# Patient Record
Sex: Male | Born: 1965 | Race: White | Hispanic: No | Marital: Married | Smoking: Former smoker
Health system: Southern US, Community
[De-identification: ages and names within clinical notes are randomized; demographics above are authoritative.]

## PROBLEM LIST (undated history)

## (undated) DIAGNOSIS — Z9109 Other allergy status, other than to drugs and biological substances: Secondary | ICD-10-CM

## (undated) DIAGNOSIS — M1990 Unspecified osteoarthritis, unspecified site: Secondary | ICD-10-CM

## (undated) DIAGNOSIS — I10 Essential (primary) hypertension: Secondary | ICD-10-CM

## (undated) DIAGNOSIS — Z8619 Personal history of other infectious and parasitic diseases: Secondary | ICD-10-CM

## (undated) DIAGNOSIS — T7840XA Allergy, unspecified, initial encounter: Secondary | ICD-10-CM

## (undated) DIAGNOSIS — K219 Gastro-esophageal reflux disease without esophagitis: Secondary | ICD-10-CM

## (undated) HISTORY — DX: Essential (primary) hypertension: I10

## (undated) HISTORY — PX: VASECTOMY: SHX75

## (undated) HISTORY — DX: Gastro-esophageal reflux disease without esophagitis: K21.9

## (undated) HISTORY — DX: Unspecified osteoarthritis, unspecified site: M19.90

## (undated) HISTORY — DX: Other allergy status, other than to drugs and biological substances: Z91.09

## (undated) HISTORY — DX: Personal history of other infectious and parasitic diseases: Z86.19

## (undated) HISTORY — DX: Allergy, unspecified, initial encounter: T78.40XA

---

## 1997-09-15 DIAGNOSIS — Z8619 Personal history of other infectious and parasitic diseases: Secondary | ICD-10-CM

## 1997-09-15 HISTORY — DX: Personal history of other infectious and parasitic diseases: Z86.19

## 2016-10-18 ENCOUNTER — Encounter (HOSPITAL_BASED_OUTPATIENT_CLINIC_OR_DEPARTMENT_OTHER): Payer: Self-pay

## 2016-10-18 ENCOUNTER — Emergency Department (HOSPITAL_COMMUNITY): Payer: BLUE CROSS/BLUE SHIELD | Admitting: Certified Registered"

## 2016-10-18 ENCOUNTER — Encounter (HOSPITAL_COMMUNITY)
Admission: EM | Disposition: A | Discharge status: Home / Self Care | Payer: Self-pay | Source: Home / Self Care | Attending: Emergency Medicine

## 2016-10-18 ENCOUNTER — Emergency Department (HOSPITAL_BASED_OUTPATIENT_CLINIC_OR_DEPARTMENT_OTHER): Payer: BLUE CROSS/BLUE SHIELD

## 2016-10-18 ENCOUNTER — Ambulatory Visit (HOSPITAL_BASED_OUTPATIENT_CLINIC_OR_DEPARTMENT_OTHER)
Admission: EM | Admit: 2016-10-18 | Discharge: 2016-10-18 | Disposition: A | Discharge status: Home / Self Care | Payer: BLUE CROSS/BLUE SHIELD | Attending: Emergency Medicine | Admitting: Emergency Medicine

## 2016-10-18 DIAGNOSIS — S62521B Displaced fracture of distal phalanx of right thumb, initial encounter for open fracture: Secondary | ICD-10-CM | POA: Diagnosis present

## 2016-10-18 DIAGNOSIS — Z23 Encounter for immunization: Secondary | ICD-10-CM | POA: Diagnosis not present

## 2016-10-18 DIAGNOSIS — W230XXA Caught, crushed, jammed, or pinched between moving objects, initial encounter: Secondary | ICD-10-CM | POA: Insufficient documentation

## 2016-10-18 DIAGNOSIS — R52 Pain, unspecified: Secondary | ICD-10-CM

## 2016-10-18 DIAGNOSIS — S6701XA Crushing injury of right thumb, initial encounter: Secondary | ICD-10-CM | POA: Insufficient documentation

## 2016-10-18 HISTORY — PX: I & D EXTREMITY: SHX5045

## 2016-10-18 SURGERY — IRRIGATION AND DEBRIDEMENT EXTREMITY
Anesthesia: General | Site: Thumb | Laterality: Right

## 2016-10-18 MED ORDER — CEFAZOLIN SODIUM 1 G IJ SOLR
INTRAMUSCULAR | Status: AC
  Filled 2016-10-18: qty 20

## 2016-10-18 MED ORDER — HYDROMORPHONE HCL 1 MG/ML IJ SOLN: 0.2500 mg | INTRAMUSCULAR | Status: DC | PRN

## 2016-10-18 MED ORDER — BUPIVACAINE HCL (PF) 0.25 % IJ SOLN
INTRAMUSCULAR | Status: DC | PRN
  Administered 2016-10-18: 10 mL

## 2016-10-18 MED ORDER — MIDAZOLAM HCL 2 MG/2ML IJ SOLN
INTRAMUSCULAR | Status: AC
  Filled 2016-10-18: qty 2

## 2016-10-18 MED ORDER — MEPERIDINE HCL 25 MG/ML IJ SOLN: 6.2500 mg | INTRAMUSCULAR | Status: DC | PRN

## 2016-10-18 MED ORDER — BUPIVACAINE HCL (PF) 0.25 % IJ SOLN
INTRAMUSCULAR | Status: AC
  Filled 2016-10-18: qty 30

## 2016-10-18 MED ORDER — PROMETHAZINE HCL 25 MG/ML IJ SOLN
6.2500 mg | INTRAMUSCULAR | Status: DC | PRN
  Administered 2016-10-18: 6.25 mg via INTRAVENOUS

## 2016-10-18 MED ORDER — OXYCODONE-ACETAMINOPHEN 5-325 MG PO TABS: ORAL_TABLET | ORAL | 0 refills | Status: DC

## 2016-10-18 MED ORDER — MORPHINE SULFATE (PF) 4 MG/ML IV SOLN
INTRAVENOUS | Status: AC
  Administered 2016-10-18: 4 mg via INTRAVENOUS
  Filled 2016-10-18: qty 1

## 2016-10-18 MED ORDER — ONDANSETRON HCL 4 MG/2ML IJ SOLN
INTRAMUSCULAR | Status: DC | PRN
  Administered 2016-10-18: 4 mg via INTRAVENOUS

## 2016-10-18 MED ORDER — OXYCODONE HCL 5 MG PO TABS: 5.0000 mg | ORAL_TABLET | Freq: Once | ORAL | Status: DC | PRN

## 2016-10-18 MED ORDER — PROMETHAZINE HCL 25 MG/ML IJ SOLN
INTRAMUSCULAR | Status: AC
  Filled 2016-10-18: qty 1

## 2016-10-18 MED ORDER — SUCCINYLCHOLINE CHLORIDE 200 MG/10ML IV SOSY
PREFILLED_SYRINGE | INTRAVENOUS | Status: AC
  Filled 2016-10-18: qty 20

## 2016-10-18 MED ORDER — FENTANYL CITRATE (PF) 100 MCG/2ML IJ SOLN
INTRAMUSCULAR | Status: AC
  Filled 2016-10-18: qty 2

## 2016-10-18 MED ORDER — CEFAZOLIN SODIUM 1 G IJ SOLR
INTRAMUSCULAR | Status: DC | PRN
  Administered 2016-10-18: 2 g via INTRAMUSCULAR

## 2016-10-18 MED ORDER — LIDOCAINE 2% (20 MG/ML) 5 ML SYRINGE
INTRAMUSCULAR | Status: AC
  Filled 2016-10-18: qty 15

## 2016-10-18 MED ORDER — MORPHINE SULFATE (PF) 4 MG/ML IV SOLN
4.0000 mg | Freq: Once | INTRAVENOUS | Status: AC
  Administered 2016-10-18: 4 mg via INTRAVENOUS

## 2016-10-18 MED ORDER — FENTANYL CITRATE (PF) 100 MCG/2ML IJ SOLN
INTRAMUSCULAR | Status: DC | PRN
  Administered 2016-10-18 (×2): 100 ug via INTRAVENOUS

## 2016-10-18 MED ORDER — SUCCINYLCHOLINE CHLORIDE 20 MG/ML IJ SOLN
INTRAMUSCULAR | Status: DC | PRN
  Administered 2016-10-18: 100 mg via INTRAVENOUS

## 2016-10-18 MED ORDER — TETANUS-DIPHTH-ACELL PERTUSSIS 5-2.5-18.5 LF-MCG/0.5 IM SUSP
0.5000 mL | Freq: Once | INTRAMUSCULAR | Status: AC
  Administered 2016-10-18: 0.5 mL via INTRAMUSCULAR
  Filled 2016-10-18: qty 0.5

## 2016-10-18 MED ORDER — PROPOFOL 10 MG/ML IV BOLUS
INTRAVENOUS | Status: AC
  Filled 2016-10-18: qty 20

## 2016-10-18 MED ORDER — LIDOCAINE HCL (CARDIAC) 20 MG/ML IV SOLN
INTRAVENOUS | Status: DC | PRN
  Administered 2016-10-18: 5 mL via INTRATRACHEAL

## 2016-10-18 MED ORDER — CEFAZOLIN IN D5W 1 GM/50ML IV SOLN
1.0000 g | Freq: Once | INTRAVENOUS | Status: AC
  Administered 2016-10-18: 1 g via INTRAVENOUS
  Filled 2016-10-18: qty 50

## 2016-10-18 MED ORDER — SULFAMETHOXAZOLE-TRIMETHOPRIM 800-160 MG PO TABS: 1.0000 | ORAL_TABLET | Freq: Two times a day (BID) | ORAL | 0 refills | Status: DC

## 2016-10-18 MED ORDER — OXYCODONE HCL 5 MG/5ML PO SOLN: 5.0000 mg | Freq: Once | ORAL | Status: DC | PRN

## 2016-10-18 MED ORDER — MIDAZOLAM HCL 2 MG/2ML IJ SOLN
INTRAMUSCULAR | Status: DC | PRN
  Administered 2016-10-18: 2 mg via INTRAVENOUS

## 2016-10-18 MED ORDER — PROPOFOL 10 MG/ML IV BOLUS
INTRAVENOUS | Status: DC | PRN
  Administered 2016-10-18: 200 mg via INTRAVENOUS

## 2016-10-18 MED ORDER — MORPHINE SULFATE (PF) 4 MG/ML IV SOLN
4.0000 mg | Freq: Once | INTRAVENOUS | Status: AC
  Administered 2016-10-18: 4 mg via INTRAVENOUS
  Filled 2016-10-18: qty 1

## 2016-10-18 MED ORDER — KETOROLAC TROMETHAMINE 30 MG/ML IJ SOLN: 30.0000 mg | Freq: Once | INTRAMUSCULAR | Status: DC

## 2016-10-18 MED ORDER — ACETAMINOPHEN 500 MG PO TABS
1000.0000 mg | ORAL_TABLET | Freq: Once | ORAL | Status: AC
  Administered 2016-10-18: 1000 mg via ORAL
  Filled 2016-10-18: qty 2

## 2016-10-18 SURGICAL SUPPLY — 32 items
BANDAGE COBAN STERILE 2 (GAUZE/BANDAGES/DRESSINGS) ×2 IMPLANT
BNDG COHESIVE 1X5 TAN STRL LF (GAUZE/BANDAGES/DRESSINGS) IMPLANT
BNDG CONFORM 2 STRL LF (GAUZE/BANDAGES/DRESSINGS) IMPLANT
BNDG ESMARK 4X9 LF (GAUZE/BANDAGES/DRESSINGS) ×2 IMPLANT
BRUSH SCRUB EZ PLAIN DRY (MISCELLANEOUS) ×2 IMPLANT
CANISTER SUCTION 2500CC (MISCELLANEOUS) ×2 IMPLANT
CORDS BIPOLAR (ELECTRODE) ×2 IMPLANT
COVER SURGICAL LIGHT HANDLE (MISCELLANEOUS) ×2 IMPLANT
DECANTER SPIKE VIAL GLASS SM (MISCELLANEOUS) ×2 IMPLANT
GAUZE SPONGE 4X4 12PLY STRL (GAUZE/BANDAGES/DRESSINGS) ×2 IMPLANT
GAUZE XEROFORM 5X9 LF (GAUZE/BANDAGES/DRESSINGS) ×2 IMPLANT
GLOVE BIO SURGEON STRL SZ7.5 (GLOVE) ×2 IMPLANT
GLOVE BIOGEL PI IND STRL 8 (GLOVE) ×1 IMPLANT
GLOVE BIOGEL PI INDICATOR 8 (GLOVE) ×1
GOWN STRL REUS W/ TWL LRG LVL3 (GOWN DISPOSABLE) ×1 IMPLANT
GOWN STRL REUS W/TWL LRG LVL3 (GOWN DISPOSABLE) ×1
K-WIRE .035 (WIRE) ×2
KIT BASIN OR (CUSTOM PROCEDURE TRAY) ×2 IMPLANT
KIT ROOM TURNOVER OR (KITS) ×2 IMPLANT
KWIRE .035 (WIRE) ×1 IMPLANT
NEEDLE HYPO 25X1 1.5 SAFETY (NEEDLE) IMPLANT
NS IRRIG 1000ML POUR BTL (IV SOLUTION) ×2 IMPLANT
PACK ORTHO EXTREMITY (CUSTOM PROCEDURE TRAY) ×2 IMPLANT
PAD ARMBOARD 7.5X6 YLW CONV (MISCELLANEOUS) ×4 IMPLANT
SCRUB BETADINE 4OZ XXX (MISCELLANEOUS) ×2 IMPLANT
SOLUTION BETADINE 4OZ (MISCELLANEOUS) ×2 IMPLANT
SPLINT FINGER W/BULB (SOFTGOODS) ×2 IMPLANT
SUT ETHILON 4 0 PS 2 18 (SUTURE) ×2 IMPLANT
SYR CONTROL 10ML LL (SYRINGE) IMPLANT
TOWEL OR 17X24 6PK STRL BLUE (TOWEL DISPOSABLE) ×2 IMPLANT
TOWEL OR 17X26 10 PK STRL BLUE (TOWEL DISPOSABLE) ×2 IMPLANT
UNDERPAD 30X30 (UNDERPADS AND DIAPERS) ×2 IMPLANT

## 2016-10-18 NOTE — Transfer of Care (Signed)
Immediate Anesthesia Transfer of Care Note  Patient: [name]  Procedure(s) Performed: Procedure(s): IRRIGATION AND DEBRIDEMENT OPEN FRACTURE PINNING OF THUMB, REPAIR OF NAILBED AND KNUCKLE ABRASIONS (Right)  Patient Location: PACU  Anesthesia Type:General  Level of Consciousness: awake, alert  and oriented  Airway & Oxygen Therapy: Patient connected to face mask oxygen  Post-op Assessment: Report given to RN and Post -op Vital signs reviewed and stable  Post vital signs: Reviewed and stable  Last Vitals:  Vitals:   10/18/16 1845 10/18/16 2053  BP: 138/86 (!) (P) 144/95  Pulse: 65   Resp:  (P) 18  Temp:  (P) 36.3 C    Last Pain:  Vitals:   10/18/16 1856  TempSrc:   PainSc: 8          Complications: No apparent anesthesia complications

## 2016-10-18 NOTE — Anesthesia Procedure Notes (Signed)
Procedure Name: Intubation Date/Time: 10/18/2016 7:53 PM Performed by: Valetta Fuller Pre-anesthesia Checklist: Patient identified, Emergency Drugs available, Suction available and Patient being monitored Patient Re-evaluated:Patient Re-evaluated prior to inductionOxygen Delivery Method: Circle system utilized Preoxygenation: Pre-oxygenation with 100% oxygen Intubation Type: IV induction Ventilation: Mask ventilation without difficulty Laryngoscope Size: Miller and 2 Tube type: Oral Tube size: 7.5 mm Number of attempts: 1 Airway Equipment and Method: Stylet Placement Confirmation: ETT inserted through vocal cords under direct vision,  positive ETCO2 and breath sounds checked- equal and bilateral Secured at: 24 cm Tube secured with: Tape Dental Injury: Teeth and Oropharynx as per pre-operative assessment

## 2016-10-18 NOTE — Discharge Instructions (Addendum)

## 2016-10-18 NOTE — Brief Op Note (Signed)
10/18/2016  8:47 PM  PATIENT:  [name]  51 y.o. male  PRE-OPERATIVE DIAGNOSIS:  open thumb fx  POST-OPERATIVE DIAGNOSIS:  open thumb fx, knuckles laceration  PROCEDURE:  Procedure(s): IRRIGATION AND DEBRIDEMENT OPEN FRACTURE PINNING OF THUMB, REPAIR OF NAILBED AND KNUCKLE ABRASIONS (Right)  SURGEON:  Surgeon(s) and Role:    * Leanora Cover, MD - Primary  PHYSICIAN ASSISTANT:   ASSISTANTS: none   ANESTHESIA:   general  EBL:  Total I/O In: -  Out: 10 [Blood:10]  BLOOD ADMINISTERED:none  DRAINS: none   LOCAL MEDICATIONS USED:  MARCAINE     SPECIMEN:  No Specimen  DISPOSITION OF SPECIMEN:  N/A  COUNTS:  YES  TOURNIQUET:   Total Tourniquet Time Documented: area (laterality) - 34 minutes Total: area (laterality) - 34 minutes   DICTATION: .Other Dictation: Dictation Number [id number]  PLAN OF CARE: Discharge to home after PACU  PATIENT DISPOSITION:  PACU - hemodynamically stable.

## 2016-10-18 NOTE — ED Triage Notes (Signed)
Pt reports washing machine dropping on right hand. Laceration to right thumb. Bleeding noted controlled. Dressing applied.

## 2016-10-18 NOTE — Op Note (Signed)
[id number] 

## 2016-10-18 NOTE — ED Notes (Signed)
Dr Fredna Dow in room. Pt's mother Trinidad Curet signed consent with physician due to pt's right hand injury and this RN signed as witness.

## 2016-10-18 NOTE — H&P (Signed)
  [name] is an 51 y.o. male.   Chief Complaint: right thumb crush HPI: 51 yo rhd male states he smashed right thumb under washing machine he was trying to move with a dolly earlier today.  Seen at [facility] where XR revealed comminuted distal phalanx fracture.  Sent to [facility] for further care.  He reports throbbing pain of 8/10 severity.  Alleviated with immobilization and elevation.  Aggravated with palpation.  Case discussed with Veryl Speak, MD and his note from 10/18/2016 reviewed. Xrays viewed and interpreted by me: ap, lateral, oblique views of thumb show comminuted distal phalanx fracture of right thumb. Labs reviewed: none  Allergies: No Known Allergies  History reviewed. No pertinent past medical history.  History reviewed. No pertinent surgical history.  Family History: History reviewed. No pertinent family history.  Social History:   reports that he has never smoked. He has never used smokeless tobacco. He reports that he drinks alcohol. His drug history is not on file.  Medications:  (Not in a hospital admission)  No results found for this or any previous visit (from the past 48 hour(s)).  Dg Hand Complete Right  Result Date: 10/18/2016 CLINICAL DATA:  51 year old male status post crush injury to hand while moving all washing machine. Initial encounter. EXAM: RIGHT HAND - COMPLETE 3+ VIEW COMPARISON:  None. FINDINGS: Clothing material about the right wrist and forearm mildly degrades bone detail there. The distal radius and ulna appear intact. Carpal bone alignment and joint spaces within normal limits. Metacarpals intact ; possible healed fracture of the fifth metacarpal. The second through fifth phalanges are intact. There is a mildly comminuted but nondisplaced fracture through the distal condyles of the thumb proximal phalanx (image 3). There is moderate to severe comminution of the right thumb distal phalanx which does appear to extend into the IP joint. The tuft fragments  are most displaced. IMPRESSION: 1. Comminution of the right thumb distal phalanx with probable intra-articular involvement. The tuft fragments are most displaced. 2. More subtle mildly comminuted fracture of the distal aspect of the thumb proximal phalanx. Electronically Signed   By: Genevie Ann M.D.   On: 10/18/2016 12:22     A comprehensive review of systems was negative. Review of Systems: No fevers, chills, night sweats, chest pain, shortness of breath, nausea, vomiting, diarrhea, constipation, easy bleeding or bruising, headaches, dizziness, vision changes, fainting.   Blood pressure 168/100, pulse 102, temperature 98 F (36.7 C), temperature source Oral, resp. rate 18, height 6\' 2"  (1.88 m), weight 136.1 kg (300 lb), SpO2 100 %.  General appearance: alert, cooperative and appears stated age Head: Normocephalic, without obvious abnormality, atraumatic Neck: supple, symmetrical, trachea midline Resp: clear to auscultation bilaterally Cardio: regular rate and rhythm GI: non-tender Extremities: Intact sensation and capillary refill all digits.  +epl/fpl/io.  Intact capillary refill in thumb, though slower on ulnar side.  Wound through nail. Able to flex and extend IP joint. Pulses: 2+ and symmetric Skin: Skin color, texture, turgor normal. No rashes or lesions Neurologic: Grossly normal Incision/Wound: as above  Assessment/Plan Right thumb crush injury with distal phalanx fracture and nail injury.  Recommend exploration in OR with possible pin fixation and repair as necessary.  Risks, benefits, and alternatives of surgery were discussed and the patient agrees with the plan of care.   Rosia Syme R 10/18/2016, 6:53 PM

## 2016-10-18 NOTE — ED Notes (Signed)
Patient transported to X-ray 

## 2016-10-18 NOTE — Anesthesia Preprocedure Evaluation (Signed)
Anesthesia Evaluation  Patient identified by MRN, date of birth, ID band Patient awake    Reviewed: Allergy & Precautions, NPO status , Patient's Chart, lab work & pertinent test results  Airway Mallampati: II  TM Distance: >3 FB Neck ROM: Full    Dental no notable dental hx.    Pulmonary neg pulmonary ROS,    Pulmonary exam normal breath sounds clear to auscultation       Cardiovascular negative cardio ROS Normal cardiovascular exam Rhythm:Regular Rate:Normal     Neuro/Psych negative neurological ROS  negative psych ROS   GI/Hepatic negative GI ROS, Neg liver ROS,   Endo/Other  negative endocrine ROS  Renal/GU negative Renal ROS  negative genitourinary   Musculoskeletal negative musculoskeletal ROS (+)   Abdominal   Peds  Hematology negative hematology ROS (+)   Anesthesia Other Findings   Reproductive/Obstetrics                             Anesthesia Physical Anesthesia Plan  ASA: II  Anesthesia Plan: General   Post-op Pain Management:    Induction: Intravenous  Airway Management Planned: Oral ETT  Additional Equipment:   Intra-op Plan:   Post-operative Plan: Extubation in OR  Informed Consent: I have reviewed the patients History and Physical, chart, labs and discussed the procedure including the risks, benefits and alternatives for the proposed anesthesia with the patient or authorized representative who has indicated his/her understanding and acceptance.   Dental advisory given  Plan Discussed with: CRNA  Anesthesia Plan Comments:         Anesthesia Quick Evaluation

## 2016-10-18 NOTE — ED Notes (Signed)
Per Dr. Stark Jock, pt can transfer with IV in place. IV wrapped with gauze and secured.

## 2016-10-18 NOTE — ED Provider Notes (Signed)
[facility] Provider Note   CSN: [id number] Arrival date & time: 10/18/16  1154     History   Chief Complaint Chief Complaint  Patient presents with  . Extremity Laceration    HPI [name] is a 51 y.o. male.  Patient is a 51 year old male with no significant past medical history. He presents for evaluation of a right arm injury. He reports he was using a dolly to move a washing machine when his thumb became pinched between the machine and the dolly. This avulsed his nail and cause a laceration to his thumb.   The history is provided by the patient.  Hand Injury   The incident occurred less than 1 hour ago. The incident occurred at home. Injury mechanism: As above. Pain location: Right thumb. The quality of the pain is described as throbbing. The pain is moderate. The pain has been constant since the incident. The symptoms are aggravated by movement and palpation.    History reviewed. No pertinent past medical history.  There are no active problems to display for this patient.   History reviewed. No pertinent surgical history.     Home Medications    Prior to Admission medications   Not on File    Family History No family history on file.  Social History Social History  Substance Use Topics  . Smoking status: Never Smoker  . Smokeless tobacco: Never Used  . Alcohol use Yes     Allergies   Patient has no known allergies.   Review of Systems Review of Systems  All other systems reviewed and are negative.    Physical Exam Updated Vital Signs BP (!) 172/114   Pulse 98   Temp 98 F (36.7 C) (Oral)   Resp 20   Ht 6\' 2"  (1.88 m)   Wt 300 lb (136.1 kg)   SpO2 99%   BMI 38.52 kg/m   Physical Exam  Constitutional: He is oriented to person, place, and time. He appears well-developed and well-nourished. No distress.  HENT:  Head: Normocephalic and atraumatic.  Neck: Normal range of motion. Neck supple.  Pulmonary/Chest: Effort  normal.  Musculoskeletal: Normal range of motion.  The right thumb has deformity at the IP joint. There is an avulsion of the thumbnail from the nailbed and a laceration that extends through the nailbed.  Neurological: He is alert and oriented to person, place, and time.  Skin: Skin is warm and dry. He is not diaphoretic.  Nursing note and vitals reviewed.        ED Treatments / Results  Labs (all labs ordered are listed, but only abnormal results are displayed) Labs Reviewed - No data to display  EKG  EKG Interpretation None       Radiology Dg Hand Complete Right  Result Date: 10/18/2016 CLINICAL DATA:  51 year old male status post crush injury to hand while moving all washing machine. Initial encounter. EXAM: RIGHT HAND - COMPLETE 3+ VIEW COMPARISON:  None. FINDINGS: Clothing material about the right wrist and forearm mildly degrades bone detail there. The distal radius and ulna appear intact. Carpal bone alignment and joint spaces within normal limits. Metacarpals intact ; possible healed fracture of the fifth metacarpal. The second through fifth phalanges are intact. There is a mildly comminuted but nondisplaced fracture through the distal condyles of the thumb proximal phalanx (image 3). There is moderate to severe comminution of the right thumb distal phalanx which does appear to extend into the IP joint. The tuft  fragments are most displaced. IMPRESSION: 1. Comminution of the right thumb distal phalanx with probable intra-articular involvement. The tuft fragments are most displaced. 2. More subtle mildly comminuted fracture of the distal aspect of the thumb proximal phalanx. Electronically Signed   By: Genevie Ann M.D.   On: 10/18/2016 12:22    Procedures Procedures (including critical care time)  Medications Ordered in ED Medications - No data to display   Initial Impression / Assessment and Plan / ED Course  I have reviewed the triage vital signs and the nursing  notes.  Pertinent labs & imaging results that were available during my care of the patient were reviewed by me and considered in my medical decision making (see chart for details).  Patient has an open fracture of his right thumb with an avulsion of the thumbnail. I've discussed this with Dr. Fredna Dow who recommends transfer to [facility] where this can be repaired. He has been given and tetanus has been updated.  Final Clinical Impressions(s) / ED Diagnoses   Final diagnoses:  Pain    New Prescriptions New Prescriptions   No medications on file     Veryl Speak, MD 10/18/16 1323

## 2016-10-18 NOTE — ED Notes (Signed)
Taken to short stay.

## 2016-10-18 NOTE — ED Notes (Signed)
ED Provider at bedside. 

## 2016-10-19 NOTE — Op Note (Addendum)
[name], [name]               ACCOUNT NO.:  [id number]  MEDICAL RECORD NO.:  [id number]  LOCATION:  [facility]03                          FACILITY:  [facility]  PHYSICIAN:  Leanora Cover, MD        DATE OF BIRTH:  [birth year]  DATE OF PROCEDURE:  10/18/2016 DATE OF DISCHARGE:                              OPERATIVE REPORT   PREOPERATIVE DIAGNOSIS:  Right thumb crush injury with open comminuted distal phalanx fracture.  POSTOPERATIVE DIAGNOSIS:  Right thumb open comminuted distal phalanx fracture with skin and nail bed lacerations.  PROCEDURE:   1. Right thumb irrigation and debridement of open distal phalanx fracture with removal of bone fragment 2. Right thumb open reduction and pin fixation of comminuted distal phalanx fracture 3. Right thumb repair of skin and nail bed lacerations. 4. Repair intermediate wound dorsum of hand, 1.5-2.0 cm.  ASSISTANT:  None.  ANESTHESIA:  General.  IV FLUIDS:  Per anesthesia flow sheet.  ESTIMATED BLOOD LOSS:  Minimal.  COMPLICATIONS:  None.  SPECIMENS:  None.  TOURNIQUET TIME:  34 minutes.  DISPOSITION:  Stable to PACU.  INDICATIONS:  [name] is a 51 year old male who smashed his right thumb while moving a washing machine earlier today.  He was seen at [facility] where radiographs were taken revealing comminuted fracture of the distal phalanx.  He was transferred to [facility] for further care.  I recommended operative irrigation, debridement, potential pin fixation of the fracture and repair of skin and nail bed lacerations in the operating room.  Risks, benefits, alternatives of the surgery were discussed including the risks of blood loss, infection, damage to nerves, vessels, tendons, ligaments, bone, failure of surgery, need for additional surgery; complications with wound healing; continued pain; nonunion; malunion; stiffness; and nail deformity.  He voiced understanding of these risks, elected to proceed.  OPERATIVE COURSE:   After being identified preoperatively by myself, the patient agreed upon procedure and site of procedure.  Surgical site was marked.  The risks, benefits, and alternatives of surgery were reviewed, he wished to proceed.  Surgical consent had been signed.  His Ancef was redosed after having been given initially in the emergency department. He was transferred to the operating room and placed on the operating room table in supine position with the right upper extremity on arm board.  General anesthesia was induced by anesthesiologist.  Right upper extremity was prepped and draped in normal sterile orthopedic fashion. Surgical pause was performed between surgeons, anesthesia, and operating staff.  All were in agreement as to the patient, procedure, and site of procedure.  A digital block was performed with 10 mL of 0.25% plain Marcaine to aid in postoperative analgesia.  Tourniquet at the proximal aspect of the extremity was inflated to 250 mmHg after exsanguination of the limb with an Esmarch bandage.  The thumb wound was explored.  There was avulsion of the nail from the nail fold.  The nail was removed with a Soil scientist.  There was a jagged laceration through the nail bed in its distal half.  There was laceration at the ulnar side of the thumb skin.  The neurovascular structures were more volar than the injury.  There was comminution to the distal phalanx.  Contaminated hematoma was removed.  I did not see any gross contamination.  The knife was used for debridement of the skin and subcutaneous tissues.  A small fragment of devitalized bone was removed.  The wound and fracture were then copiously irrigated with sterile saline by bulb syringe.  The C-arm was used in AP and lateral projections.  Reduction of the distal phalanx fracture was performed, and a 0.035-inch K-wire was advanced from the tip of the thumb across the fracture site, across the DIP joint to stabilize the distal phalanx  fracture.  The skin wound was repaired with 5-0 Monocryl suture in an interrupted fashion.  The nail bed laceration was repaired with 6-0 chromic suture in interrupted fashion.  There was significant stellate laceration to the central portion of the nail bed.  Reapproximation of soft tissues was achieved, however.  There was an additional wound on the dorsum of the hand over the long finger metacarpal.  This was explored.  The extensor tendon was visualized and was intact.  There was some small amount of damage to the ulnar side of the tendon, but not enough requiring repair.  This wound was also copiously irrigated with sterile saline, closed with a 5-0 Monocryl suture in a horizontal mattress fashion.  The wound was approximately 1 1/2 to 2 cm in total length.  It was stellate in nature. The wounds were all dressed with sterile Xeroform, and a piece of Xeroform was placed in the nail fold.  They were then dressed with sterile 4x4s and wrapped with a Coban dressing lightly.  Alumafoam splint was placed on the thumb and wrapped lightly with a Coban dressing.  The tourniquet was deflated at 34 minutes.  All fingertips were pink with brisk capillary refill after deflation of tourniquet. Operative drapes were broken down.  The patient was awakened from anesthesia safely.  He was transferred back to stretcher and taken to PACU in stable condition.  I will see him back in the office in 1 week for postoperative followup.  I will give him Percocet 5/325 one to two p.o. q.6 hours p.r.n. pain dispensed #30 and Bactrim DS 1 p.o. b.i.d. x7 days.     Leanora Cover, MD     KK/MEDQ  D:  10/18/2016  T:  10/19/2016  Job:  [id number]  Addendum (10/31/16): Clarify procedures in summary and body of note.

## 2016-10-19 NOTE — Anesthesia Postprocedure Evaluation (Signed)
Anesthesia Post Note  Patient: [name]  Procedure(s) Performed: Procedure(s) (LRB): IRRIGATION AND DEBRIDEMENT OPEN FRACTURE PINNING OF THUMB, REPAIR OF NAILBED AND KNUCKLE ABRASIONS (Right)  Patient location during evaluation: PACU Anesthesia Type: General Level of consciousness: sedated and patient cooperative Pain management: pain level controlled Vital Signs Assessment: post-procedure vital signs reviewed and stable Respiratory status: spontaneous breathing Cardiovascular status: stable Anesthetic complications: no       Last Vitals:  Vitals:   10/18/16 2200 10/18/16 2215  BP: 127/86 133/86  Pulse:    Resp:    Temp:      Last Pain:  Vitals:   10/18/16 2215  TempSrc:   PainSc: 0-No pain                 Nolon Nations

## 2016-10-20 ENCOUNTER — Encounter (HOSPITAL_COMMUNITY): Payer: Self-pay | Admitting: Orthopedic Surgery

## 2017-03-11 ENCOUNTER — Ambulatory Visit (INDEPENDENT_AMBULATORY_CARE_PROVIDER_SITE_OTHER): Payer: BLUE CROSS/BLUE SHIELD | Admitting: Physician Assistant

## 2017-03-11 ENCOUNTER — Encounter: Payer: Self-pay | Admitting: Physician Assistant

## 2017-03-11 VITALS — BP 120/86 | HR 71 | Temp 98.3°F | Resp 16 | Ht 72.75 in | Wt 301.0 lb

## 2017-03-11 DIAGNOSIS — C44622 Squamous cell carcinoma of skin of right upper limb, including shoulder: Secondary | ICD-10-CM | POA: Insufficient documentation

## 2017-03-11 DIAGNOSIS — R5382 Chronic fatigue, unspecified: Secondary | ICD-10-CM

## 2017-03-11 DIAGNOSIS — Z8619 Personal history of other infectious and parasitic diseases: Secondary | ICD-10-CM | POA: Diagnosis not present

## 2017-03-11 DIAGNOSIS — Z0000 Encounter for general adult medical examination without abnormal findings: Secondary | ICD-10-CM | POA: Insufficient documentation

## 2017-03-11 LAB — COMPREHENSIVE METABOLIC PANEL
ALK PHOS: 59 U/L (ref 39–117)
ALT: 178 U/L — AB (ref 0–53)
AST: 130 U/L — ABNORMAL HIGH (ref 0–37)
Albumin: 4.6 g/dL (ref 3.5–5.2)
BILIRUBIN TOTAL: 0.6 mg/dL (ref 0.2–1.2)
BUN: 13 mg/dL (ref 6–23)
CALCIUM: 10 mg/dL (ref 8.4–10.5)
CO2: 29 mEq/L (ref 19–32)
Chloride: 101 mEq/L (ref 96–112)
Creatinine, Ser: 0.96 mg/dL (ref 0.40–1.50)
GFR: 88.4 mL/min (ref >60.00)
GLUCOSE: 109 mg/dL — AB (ref 70–99)
POTASSIUM: 4.8 meq/L (ref 3.5–5.1)
Sodium: 136 mEq/L (ref 135–145)
TOTAL PROTEIN: 7.5 g/dL (ref 6.0–8.3)

## 2017-03-11 LAB — CBC WITH DIFFERENTIAL/PLATELET
Basophils Absolute: 0 10*3/uL (ref 0.0–0.1)
Basophils Relative: 0.7 % (ref 0.0–3.0)
EOS PCT: 1.3 % (ref 0.0–5.0)
Eosinophils Absolute: 0.1 10*3/uL (ref 0.0–0.7)
HEMATOCRIT: 48.6 % (ref 39.0–52.0)
Hemoglobin: 17.1 g/dL — ABNORMAL HIGH (ref 13.0–17.0)
LYMPHS ABS: 1.1 10*3/uL (ref 0.7–4.0)
LYMPHS PCT: 18.3 % (ref 12.0–46.0)
MCHC: 35.1 g/dL (ref 30.0–36.0)
MCV: 92.7 fl (ref 78.0–100.0)
MONOS PCT: 9.4 % (ref 3.0–12.0)
Monocytes Absolute: 0.6 10*3/uL (ref 0.1–1.0)
NEUTROS ABS: 4.3 10*3/uL (ref 1.4–7.7)
NEUTROS PCT: 70.3 % (ref 43.0–77.0)
Platelets: 161 10*3/uL (ref 150.0–400.0)
RBC: 5.25 Mil/uL (ref 4.22–5.81)
RDW: 13.2 % (ref 11.5–15.5)
WBC: 6.1 10*3/uL (ref 4.0–10.5)

## 2017-03-11 LAB — LIPID PANEL
CHOLESTEROL: 177 mg/dL (ref 0–200)
HDL: 40.5 mg/dL (ref >39.00)
LDL Cholesterol: 114 mg/dL — ABNORMAL HIGH (ref 0–99)
NONHDL: 136.97
TRIGLYCERIDES: 115 mg/dL (ref 0.0–149.0)
Total CHOL/HDL Ratio: 4
VLDL: 23 mg/dL (ref 0.0–40.0)

## 2017-03-11 LAB — URINALYSIS, ROUTINE W REFLEX MICROSCOPIC
BILIRUBIN URINE: NEGATIVE
HGB URINE DIPSTICK: NEGATIVE
KETONES UR: NEGATIVE
LEUKOCYTES UA: NEGATIVE
NITRITE: NEGATIVE
PH: 6 (ref 5.0–8.0)
RBC / HPF: NONE SEEN (ref 0–?)
Specific Gravity, Urine: 1.02 (ref 1.000–1.030)
Total Protein, Urine: NEGATIVE
UROBILINOGEN UA: 0.2 (ref 0.0–1.0)
Urine Glucose: NEGATIVE
WBC UA: NONE SEEN (ref 0–?)

## 2017-03-11 LAB — TSH: TSH: 1.72 u[IU]/mL (ref 0.35–4.50)

## 2017-03-11 LAB — HEMOGLOBIN A1C: Hgb A1c MFr Bld: 5.3 % (ref 4.6–6.5)

## 2017-03-11 LAB — VITAMIN B12: VITAMIN B 12: 330 pg/mL (ref 211–911)

## 2017-03-11 NOTE — Progress Notes (Signed)
Pre visit review using our clinic review tool, if applicable. No additional management support is needed unless otherwise documented below in the visit note. 

## 2017-03-11 NOTE — Assessment & Plan Note (Signed)
Referral to Dermatology placed for management.

## 2017-03-11 NOTE — Progress Notes (Signed)
Patient presents to clinic today to establish care. Patient notes he has not seen a PCP in 3 years or so.   Patient is not currently doing any form of exercise. 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 Has recently started limiting carbohydrate intake.   Acute Concerns: Patient endorses gradual increasing fatigue over the past couple of years associated with weight gain. Notes times where he feels cold-natured. Notes dryness of skin. Denies urinary urgency, frequency, polyuria, polydipsia or polyphagia. Notes gradual decrease in visual acuity. Has noted several pound weight gain in the past year. Patent denies depressed mood or anhedonia. Denies anxiety.   Chronic Issues: History of Hepatitis C -- Diagnosed in 1999. Endorses treatment with Ribovarin and interferon in 2000-2001. Noted to be cured in 2001. Patient requesting Hep C RNA test to make sure that he is cured.    Health Maintenance: Immunizations -- Tetanus up-to-date.  HIV -- Denies concerns. Declines check today.   Past Medical History:  Diagnosis Date  . Arthritis    Lumbar Spine. Diagnosed around 15 years ago per patient.   . Environmental allergies   . History of chickenpox   . History of hepatitis C 1999   Treatment in 2000-2001. Ribovarin and Interferon injections. Late 2001 - Test of Cure    Past Surgical History:  Procedure Laterality Date  . I&D EXTREMITY Right 10/18/2016   Procedure: IRRIGATION AND DEBRIDEMENT OPEN FRACTURE PINNING OF THUMB, REPAIR OF NAILBED AND KNUCKLE ABRASIONS;  Surgeon: Leanora Cover, MD;  Location: [facility];  Service: Orthopedics;  Laterality: Right;  Marland Kitchen VASECTOMY      No current outpatient prescriptions on file prior to visit.   No current facility-administered medications on file prior to visit.     Allergies  Allergen Reactions  . Pollen Extract Shortness Of Breath and Swelling    Grass     Family History  Problem Relation Age of Onset  . Cancer Mother    Breast  . Healthy Father   . Healthy Sister   . Hypertension Maternal Uncle   . Dementia Maternal Uncle   . Alzheimer's disease Maternal Grandmother   . Hypertension Maternal Grandfather   . Alzheimer's disease Maternal Grandfather   . Heart attack Maternal Grandfather     Social History   Social History  . Marital status: Unknown    Spouse name: N/A  . Number of children: N/A  . Years of education: N/A   Occupational History  . Not on file.   Social History Main Topics  . Smoking status: Former Smoker    Years: 20.00    Types: Cigars    Quit date: 09/15/2012  . Smokeless tobacco: Never Used  . Alcohol use Yes     Comment: 10-12 Beers per week  . Drug use: No  . Sexual activity: Yes    Birth control/ protection: Surgical   Other Topics Concern  . Not on file   Social History Narrative  . No narrative on file   Review of Systems  Constitutional: Negative for fever and weight loss.  HENT: Negative for ear discharge, ear pain, hearing loss and tinnitus.   Eyes: Negative for blurred vision, double vision, photophobia and pain.  Respiratory: Negative for cough and shortness of breath.   Cardiovascular: Negative for chest pain and palpitations.  Gastrointestinal: Negative for abdominal pain, blood in stool, constipation, diarrhea, heartburn, melena, nausea and vomiting.  Genitourinary: Negative for dysuria, flank pain, frequency, hematuria and urgency.  Musculoskeletal:  Negative for falls.  Neurological: Negative for dizziness, loss of consciousness and headaches.  Endo/Heme/Allergies: Negative for environmental allergies.  Psychiatric/Behavioral: Negative for depression, hallucinations, substance abuse and suicidal ideas. The patient is not nervous/anxious and does not have insomnia.    BP 120/86   Pulse 71   Temp 98.3 F (36.8 C) (Oral)   Resp 16   Ht 6' 0.75" (1.848 m)   Wt (!) 301 lb (136.5 kg)   SpO2 98%   BMI 39.99 kg/m   Physical Exam  Constitutional:  He is oriented to person, place, and time and well-developed, well-nourished, and in no distress.  HENT:  Head: Normocephalic and atraumatic.  Right Ear: External ear normal.  Left Ear: External ear normal.  Nose: Nose normal.  Mouth/Throat: Oropharynx is clear and moist. No oropharyngeal exudate.  Eyes: Conjunctivae and EOM are normal. Pupils are equal, round, and reactive to light.  Neck: Neck supple. No thyromegaly present.  Cardiovascular: Normal rate, regular rhythm, normal heart sounds and intact distal pulses.   Pulmonary/Chest: Effort normal and breath sounds normal. No respiratory distress. He has no wheezes. He has no rales. He exhibits no tenderness.  Abdominal: Soft. Bowel sounds are normal. He exhibits no distension and no mass. There is no tenderness. There is no rebound and no guarding.  Genitourinary: Testes/scrotum normal.  Lymphadenopathy:    He has no cervical adenopathy.  Neurological: He is alert and oriented to person, place, and time.  Skin: Skin is warm and dry. No rash noted.     Psychiatric: Affect normal.  Vitals reviewed.  Assessment/Plan: Visit for preventive health examination Depression screen negative. Health Maintenance reviewed -- Tetanus up-to-date. Declines HIV screen. Preventive schedule discussed and handout given in AVS. Will obtain fasting labs today.   SCC (squamous cell carcinoma), arm, right Referral to Dermatology placed for management.   History of hepatitis C RNA level today.  Chronic fatigue Unclear. No clinical signs/symptoms of depression or anxiety. Concern for diabetes or hypothyroidism giving body habitus and symptoms. Will check Vitamin D and B12 levels. If all unremarkable, will consider testosterone level. Diet and exercise reviewed with patient.     Leeanne Rio, PA-C

## 2017-03-11 NOTE — Assessment & Plan Note (Signed)
Unclear. No clinical signs/symptoms of depression or anxiety. Concern for diabetes or hypothyroidism giving body habitus and symptoms. Will check Vitamin D and B12 levels. If all unremarkable, will consider testosterone level. Diet and exercise reviewed with patient.

## 2017-03-11 NOTE — Assessment & Plan Note (Signed)
RNA level today.

## 2017-03-11 NOTE — Assessment & Plan Note (Signed)
Depression screen negative. Health Maintenance reviewed -- Tetanus up-to-date. Declines HIV screen. Preventive schedule discussed and handout given in AVS. Will obtain fasting labs today.

## 2017-03-11 NOTE — Patient Instructions (Signed)
Please go to the lab for blood work.   Our office will call you with your results unless you have chosen to receive results via MyChart.  If your blood work is normal we will follow-up each year for physicals and as scheduled for chronic medical problems.  If anything is abnormal we will treat accordingly and get you in for a follow-up.  You will be contacted for assessment by Dermatology for management of the concerning skin lesions. Always wear sunscreen!  Work on increasing exercise to 150 minutes per week. Keep working on [employer].  We will alter treatment regimen based on your results.   Preventive Care 40-64 Years, Male Preventive care refers to lifestyle choices and visits with your health care provider that can promote health and wellness. What does preventive care include?  A yearly physical exam. This is also called an annual well check.  Dental exams once or twice a year.  Routine eye exams. Ask your health care provider how often you should have your eyes checked.  Personal lifestyle choices, including: ? Daily care of your teeth and gums. ? Regular physical activity. ? Eating a healthy diet. ? Avoiding tobacco and drug use. ? Limiting alcohol use. ? Practicing safe sex. ? Taking low-dose aspirin every day starting at age 40. What happens during an annual well check? The services and screenings done by your health care provider during your annual well check will depend on your age, overall health, lifestyle risk factors, and family history of disease. Counseling Your health care provider may ask you questions about your:  Alcohol use.  Tobacco use.  Drug use.  Emotional well-being.  Home and relationship well-being.  Sexual activity.  Eating habits.  Work and work Statistician.  Screening You may have the following tests or measurements:  Height, weight, and BMI.  Blood pressure.  Lipid and cholesterol levels. These may be checked every 5  years, or more frequently if you are over 38 years old.  Skin check.  Lung cancer screening. You may have this screening every year starting at age 46 if you have a 30-pack-year history of smoking and currently smoke or have quit within the past 15 years.  Fecal occult blood test (FOBT) of the stool. You may have this test every year starting at age 76.  Flexible sigmoidoscopy or colonoscopy. You may have a sigmoidoscopy every 5 years or a colonoscopy every 10 years starting at age 87.  Prostate cancer screening. Recommendations will vary depending on your family history and other risks.  Hepatitis C blood test.  Hepatitis B blood test.  Sexually transmitted disease (STD) testing.  Diabetes screening. This is done by checking your blood sugar (glucose) after you have not eaten for a while (fasting). You may have this done every 1-3 years.  Discuss your test results, treatment options, and if necessary, the need for more tests with your health care provider. Vaccines Your health care provider may recommend certain vaccines, such as:  Influenza vaccine. This is recommended every year.  Tetanus, diphtheria, and acellular pertussis (Tdap, Td) vaccine. You may need a Td booster every 10 years.  Varicella vaccine. You may need this if you have not been vaccinated.  Zoster vaccine. You may need this after age 11.  Measles, mumps, and rubella (MMR) vaccine. You may need at least one dose of MMR if you were born in 1957 or later. You may also need a second dose.  Pneumococcal 13-valent conjugate (PCV13) vaccine. You may need  this if you have certain conditions and have not been vaccinated.  Pneumococcal polysaccharide (PPSV23) vaccine. You may need one or two doses if you smoke cigarettes or if you have certain conditions.  Meningococcal vaccine. You may need this if you have certain conditions.  Hepatitis A vaccine. You may need this if you have certain conditions or if you travel or  work in places where you may be exposed to hepatitis A.  Hepatitis B vaccine. You may need this if you have certain conditions or if you travel or work in places where you may be exposed to hepatitis B.  Haemophilus influenzae type b (Hib) vaccine. You may need this if you have certain risk factors.  Talk to your health care provider about which screenings and vaccines you need and how often you need them. This information is not intended to replace advice given to you by your health care provider. Make sure you discuss any questions you have with your health care provider. Document Released: 09/28/2015 Document Revised: 05/21/2016 Document Reviewed: 07/03/2015 Elsevier Interactive Patient Education  2017 [employer].

## 2017-03-13 ENCOUNTER — Other Ambulatory Visit: Payer: BLUE CROSS/BLUE SHIELD

## 2017-03-13 ENCOUNTER — Other Ambulatory Visit: Payer: Self-pay | Admitting: *Deleted

## 2017-03-13 DIAGNOSIS — Z8619 Personal history of other infectious and parasitic diseases: Secondary | ICD-10-CM

## 2017-03-13 LAB — HEPATITIS C RNA QUANTITATIVE
HCV Quantitative Log: 6.78 Log IU/mL — ABNORMAL HIGH
HCV Quantitative: 5980000 IU/mL — ABNORMAL HIGH

## 2017-03-14 LAB — HEPATITIS PANEL, ACUTE
HCV AB: REACTIVE — AB
Hep A IgM: NONREACTIVE
Hep B C IgM: NONREACTIVE
Hepatitis B Surface Ag: NEGATIVE

## 2017-03-14 LAB — VITAMIN D 1,25 DIHYDROXY
VITAMIN D 1, 25 (OH) TOTAL: 46 pg/mL (ref 18–72)
VITAMIN D3 1, 25 (OH): 46 pg/mL

## 2017-03-16 ENCOUNTER — Telehealth: Payer: Self-pay | Admitting: Physician Assistant

## 2017-03-16 NOTE — Telephone Encounter (Signed)
Faxed referral as request and call their office, was told that pt should be called in the next 3 days and scheduled by end of July first of August. They will fax appt info as soon as pt has been schedule.

## 2017-03-16 NOTE — Telephone Encounter (Signed)
Please see paperwork given regarding referral to [facility] -- Patient with history of Hep C s/p treatement in 2001 and was told he was cured . Was seen as new patient last week for CPE. Liver enzymes elevated with significantly elevated Hep C RNA level. Needs assessment and management ASAp please.   Please keep me updated on status of referral.

## 2017-03-17 LAB — HEPATITIS C RNA QUANTITATIVE
HCV QUANT: 9680000 [IU]/mL — AB
HCV Quantitative Log: 6.99 Log IU/mL — ABNORMAL HIGH

## 2017-03-24 NOTE — Telephone Encounter (Signed)
Pt has been scheduled for 04/06/17 @ 1:30 with [facility]

## 2017-04-07 ENCOUNTER — Other Ambulatory Visit (HOSPITAL_COMMUNITY): Payer: Self-pay | Admitting: Nurse Practitioner

## 2017-04-07 DIAGNOSIS — K7400 Hepatic fibrosis, unspecified: Secondary | ICD-10-CM

## 2017-04-07 DIAGNOSIS — B182 Chronic viral hepatitis C: Secondary | ICD-10-CM

## 2017-04-21 ENCOUNTER — Ambulatory Visit (HOSPITAL_COMMUNITY)
Admission: RE | Admit: 2017-04-21 | Discharge: 2017-04-21 | Disposition: A | Discharge status: Home / Self Care | Payer: BLUE CROSS/BLUE SHIELD | Source: Ambulatory Visit | Attending: Nurse Practitioner | Admitting: Nurse Practitioner

## 2017-04-21 DIAGNOSIS — K7400 Hepatic fibrosis, unspecified: Secondary | ICD-10-CM

## 2017-04-21 DIAGNOSIS — B182 Chronic viral hepatitis C: Secondary | ICD-10-CM | POA: Insufficient documentation

## 2017-05-05 ENCOUNTER — Encounter: Payer: Self-pay | Admitting: Physician Assistant

## 2017-05-07 ENCOUNTER — Ambulatory Visit (INDEPENDENT_AMBULATORY_CARE_PROVIDER_SITE_OTHER): Payer: BLUE CROSS/BLUE SHIELD | Admitting: General Practice

## 2017-05-07 DIAGNOSIS — Z23 Encounter for immunization: Secondary | ICD-10-CM

## 2017-05-07 NOTE — Progress Notes (Signed)
Pt given twinrix per PCP recommendations, due to Hep C

## 2017-05-07 NOTE — Progress Notes (Signed)
Noted. Patient scheduled for next immunization.   Leeanne Rio, PA-C

## 2017-06-08 ENCOUNTER — Ambulatory Visit (INDEPENDENT_AMBULATORY_CARE_PROVIDER_SITE_OTHER): Payer: BLUE CROSS/BLUE SHIELD | Admitting: *Deleted

## 2017-06-08 DIAGNOSIS — Z23 Encounter for immunization: Secondary | ICD-10-CM

## 2017-11-19 ENCOUNTER — Other Ambulatory Visit: Payer: Self-pay | Admitting: Nurse Practitioner

## 2017-11-19 DIAGNOSIS — K7400 Hepatic fibrosis, unspecified: Secondary | ICD-10-CM

## 2017-12-07 ENCOUNTER — Ambulatory Visit (INDEPENDENT_AMBULATORY_CARE_PROVIDER_SITE_OTHER): Payer: BLUE CROSS/BLUE SHIELD | Admitting: *Deleted

## 2017-12-07 DIAGNOSIS — Z23 Encounter for immunization: Secondary | ICD-10-CM

## 2017-12-17 ENCOUNTER — Other Ambulatory Visit: Payer: BLUE CROSS/BLUE SHIELD

## 2017-12-31 ENCOUNTER — Other Ambulatory Visit: Payer: BLUE CROSS/BLUE SHIELD

## 2018-01-11 ENCOUNTER — Other Ambulatory Visit: Payer: BLUE CROSS/BLUE SHIELD

## 2018-02-01 ENCOUNTER — Ambulatory Visit
Admission: RE | Admit: 2018-02-01 | Discharge: 2018-02-01 | Disposition: A | Discharge status: Home / Self Care | Payer: BLUE CROSS/BLUE SHIELD | Source: Ambulatory Visit | Attending: Nurse Practitioner | Admitting: Nurse Practitioner

## 2018-02-01 DIAGNOSIS — K7400 Hepatic fibrosis, unspecified: Secondary | ICD-10-CM

## 2018-10-21 ENCOUNTER — Encounter: Payer: BLUE CROSS/BLUE SHIELD | Admitting: Physician Assistant

## 2019-03-20 IMAGING — US US ABDOMEN COMPLETE W/ ELASTOGRAPHY
1 series · 13 of 25 positions shown · non-contrast
Comparison: None.

CLINICAL DATA: Chronic hepatitis-C since 3333.



[Series 1: us abdomen complete w/ elastography · 0.26mm/px · 13 of 73 slices shown]
[im 1/73]
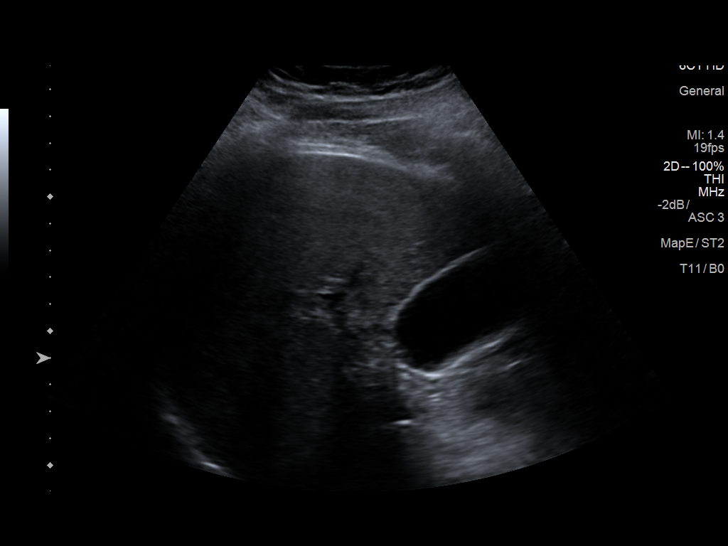
[im 7/73]
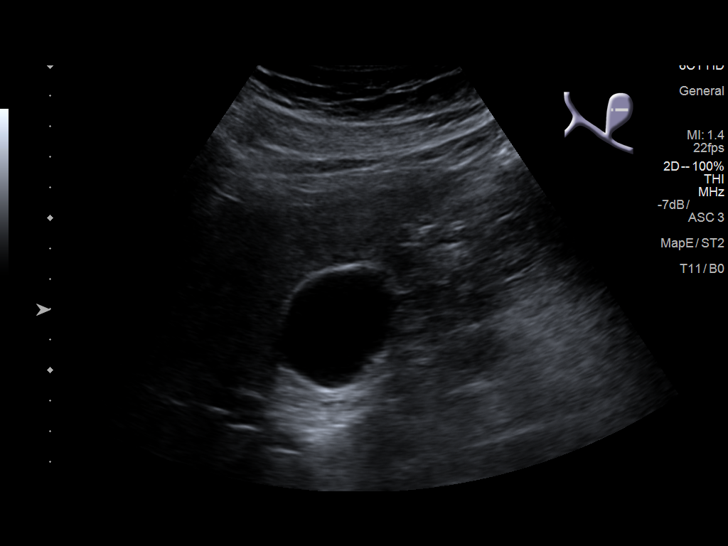
[im 13/73]
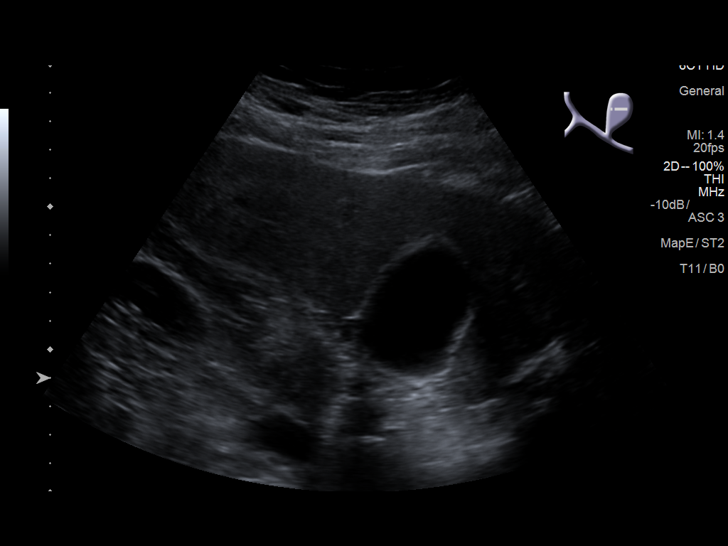
[im 19/73]
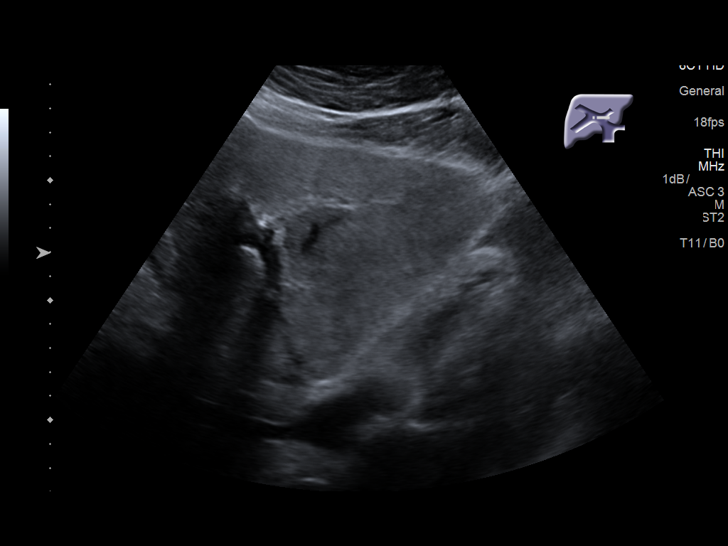
[im 25/73]
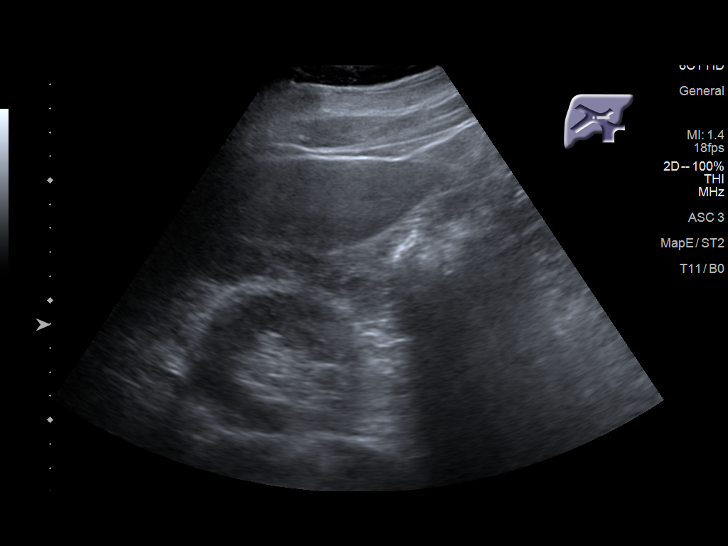
[im 31/73]
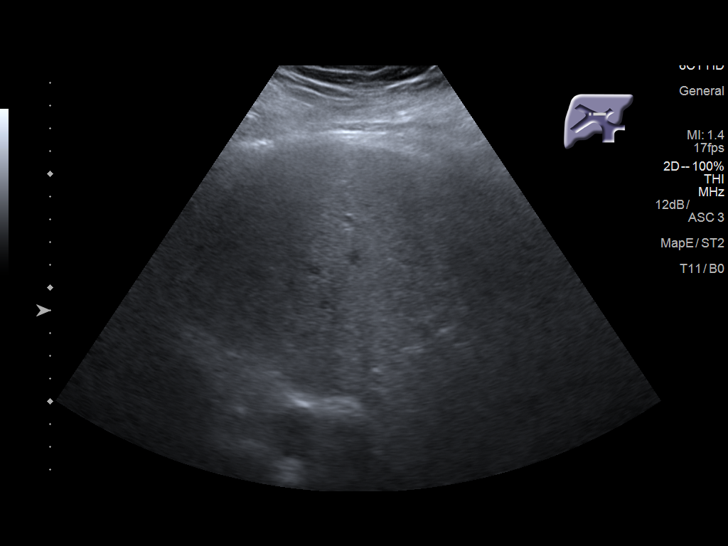
[im 37/73]
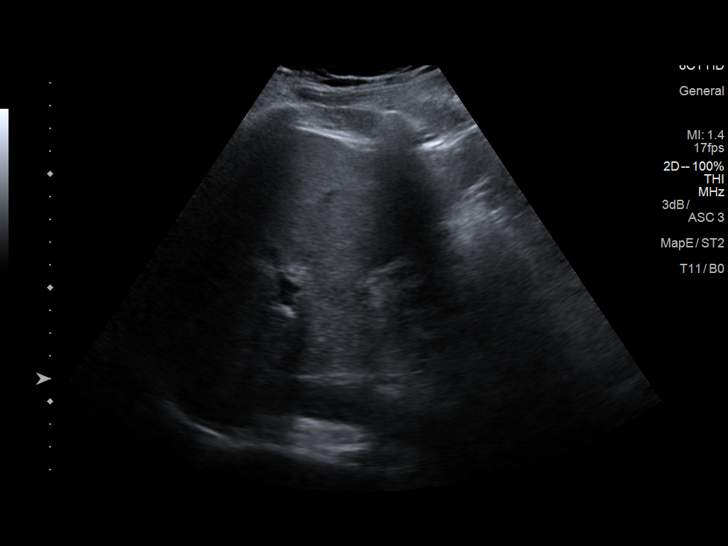
[im 43/73]
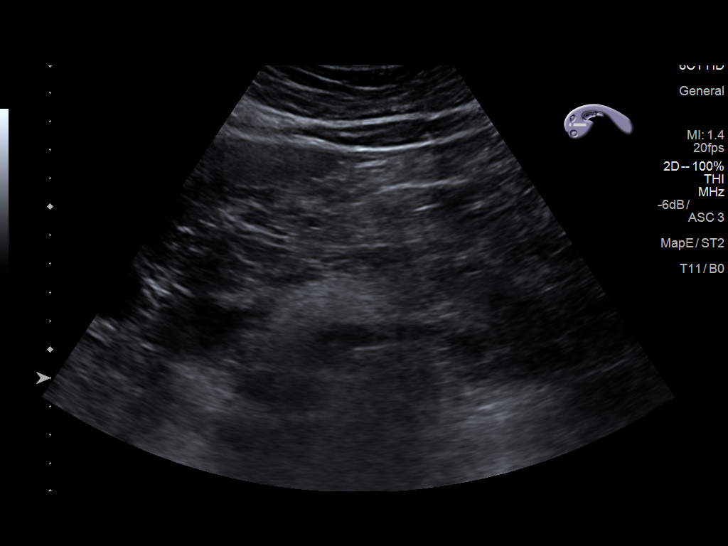
[im 49/73]
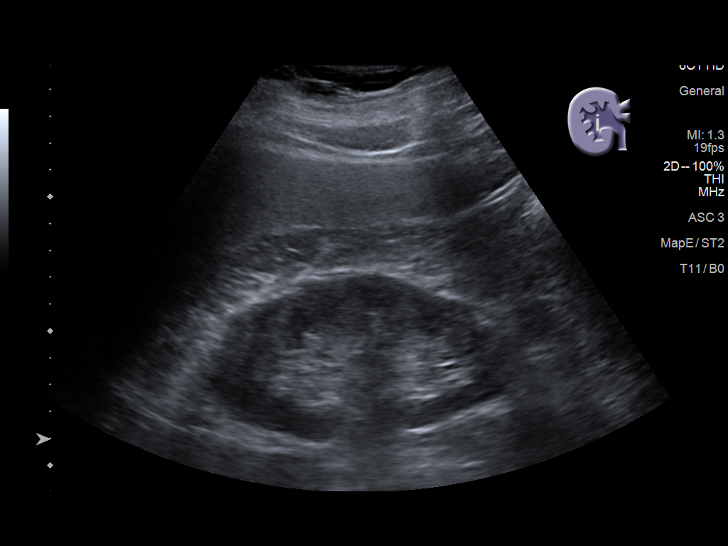
[im 55/73]
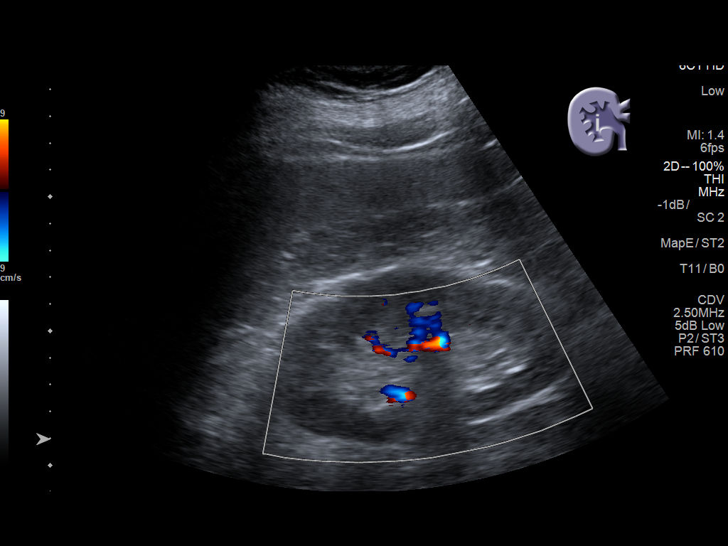
[im 61/73]
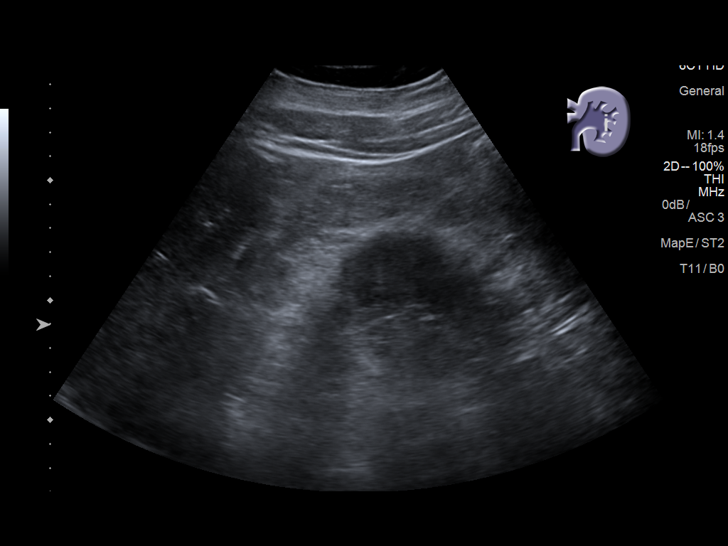
[im 67/73]
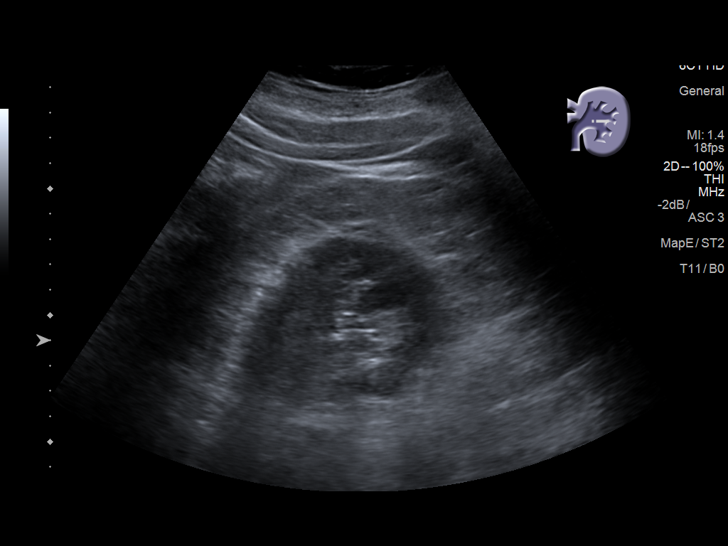
[im 73/73]
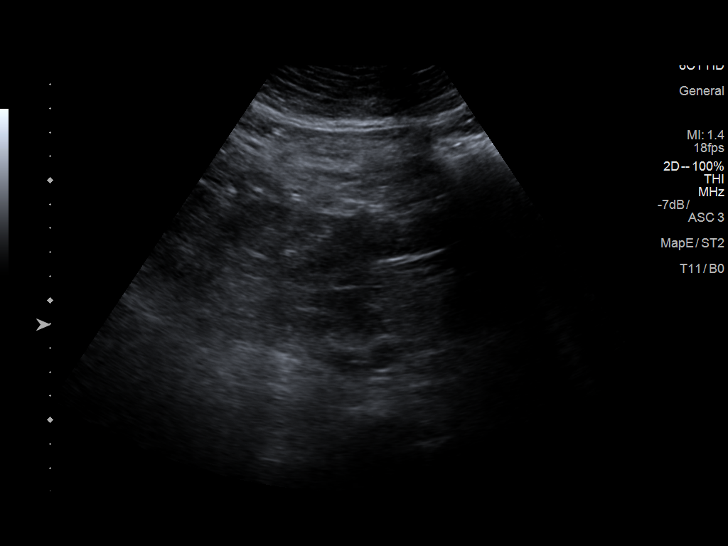

[13 of 25 positions shown; findings below may reference images not displayed]

FINDINGS: ULTRASOUND ABDOMEN

Gallbladder: No gallstones or wall thickening visualized. No
sonographic Murphy sign noted by sonographer.

Common bile duct: Diameter: Normal, 3 mm

Liver: No focal lesion identified. Within normal limits in
parenchymal echogenicity.

IVC: No abnormality visualized.

Pancreas: Visualized portion unremarkable.

Spleen: Size and appearance within normal limits.

Right Kidney: Length: 12.4 cm. Echogenicity within normal limits. No
mass or hydronephrosis visualized.

Left Kidney: Length: 12.9 cm. Echogenicity within normal limits. No
mass or hydronephrosis visualized.

Abdominal aorta: No aneurysm visualized.

Other findings: No ascites.

ULTRASOUND HEPATIC ELASTOGRAPHY

Device: Siemens Helix VTQ

Patient position: Supine

Transducer [id number]

Number of measurements: 10

Hepatic segment:  8

Median velocity:   3.26  m/sec

IQR:

IQR/Median velocity ratio:

Corresponding Metavir fibrosis score:  Some F3 + F4

Risk of fibrosis: High

Limitations of exam: None

Pertinent findings noted on other imaging exams:  None

Please note that abnormal shear wave velocities may also be
identified in clinical settings other than with hepatic fibrosis,
such as: acute hepatitis, elevated right heart and central venous
pressures including use of beta blockers, Nega disease
(Somme), infiltrative processes such as
mastocytosis/amyloidosis/infiltrative tumor, extrahepatic
cholestasis, in the post-prandial state, and liver transplantation.
Correlation with patient history, laboratory data, and clinical
condition recommended.
IMPRESSION: ULTRASOUND ABDOMEN:
Normal abdominal ultrasound.

ULTRASOUND HEPATIC ELASTOGRAPHY:

Median hepatic shear wave velocity is calculated at 3.26 m/sec.

Corresponding Metavir fibrosis score is  Some F3 + F4.

Risk of fibrosis is High.

Follow-up: Follow up advised

## 2019-12-31 IMAGING — US US ABDOMEN LIMITED
1 series · 14 of 25 positions shown · non-contrast
Comparison: 04/21/2017

CLINICAL DATA: Liver fibrosis.  History of hepatitis C.

EXAM:
ULTRASOUND ABDOMEN LIMITED RIGHT UPPER QUADRANT

[Series 1: us abdomen limited · 0.19mm/px · 14 of 41 slices shown]
[im 1/41]
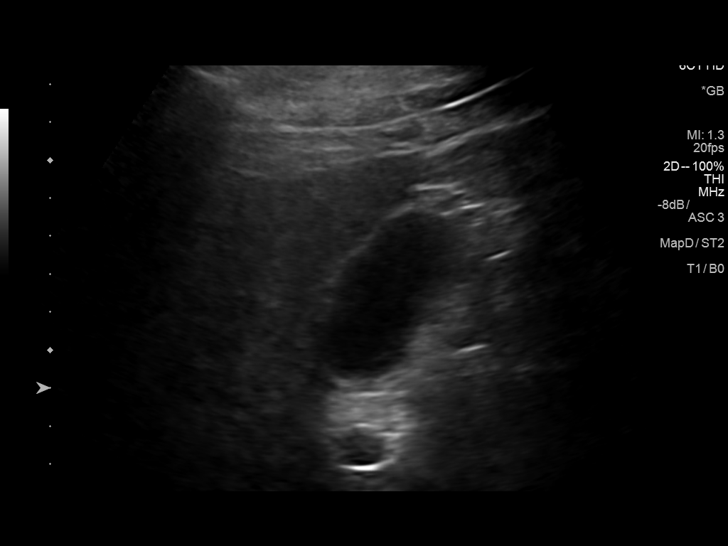
[im 4/41]
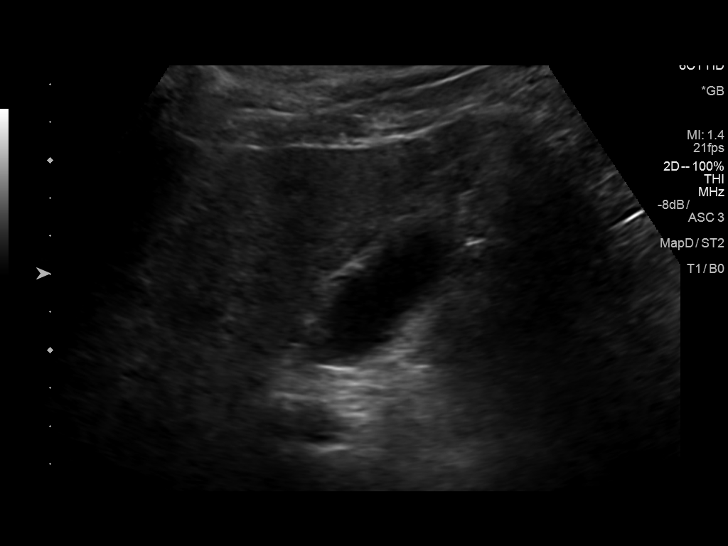
[im 7/41]
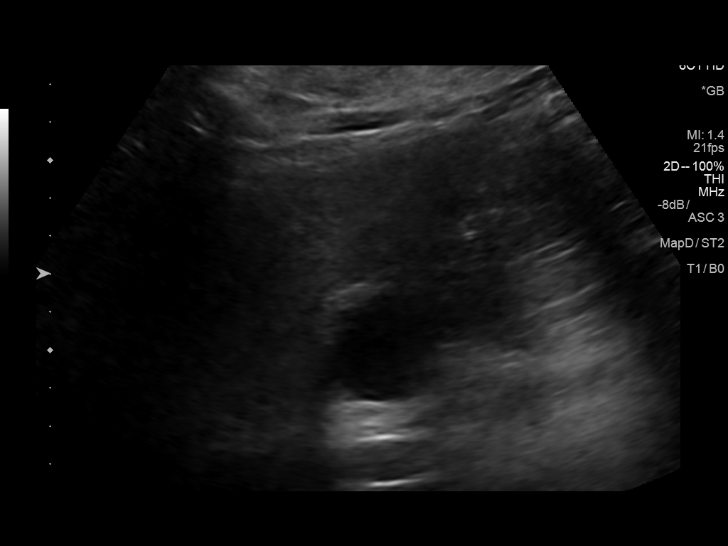
[im 11/41]
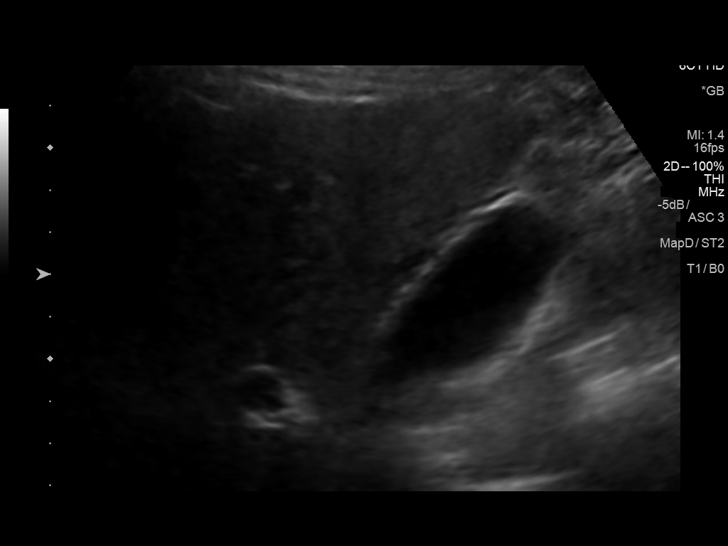
[im 14/41]
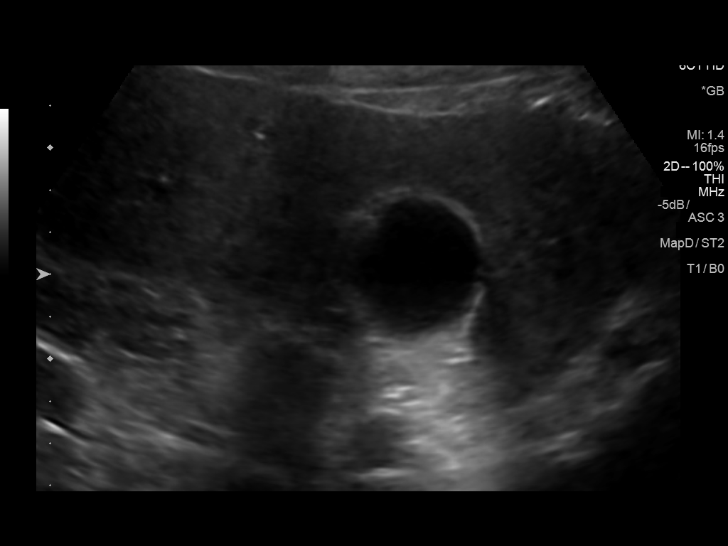
[im 16/41]
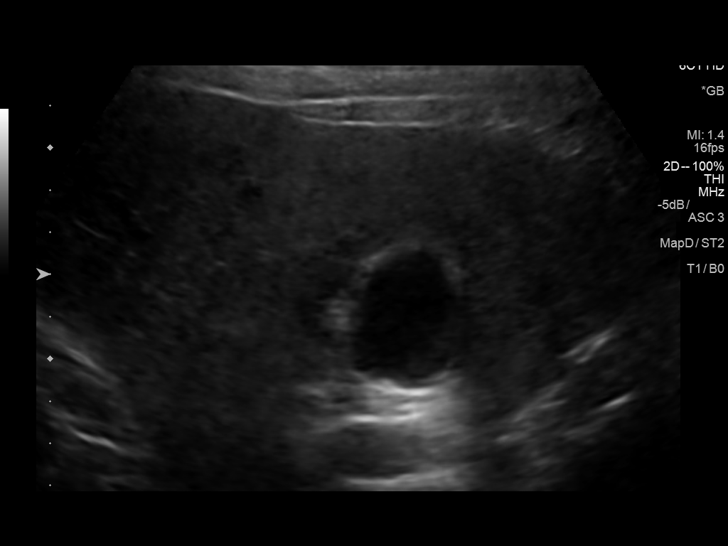
[im 19/41]
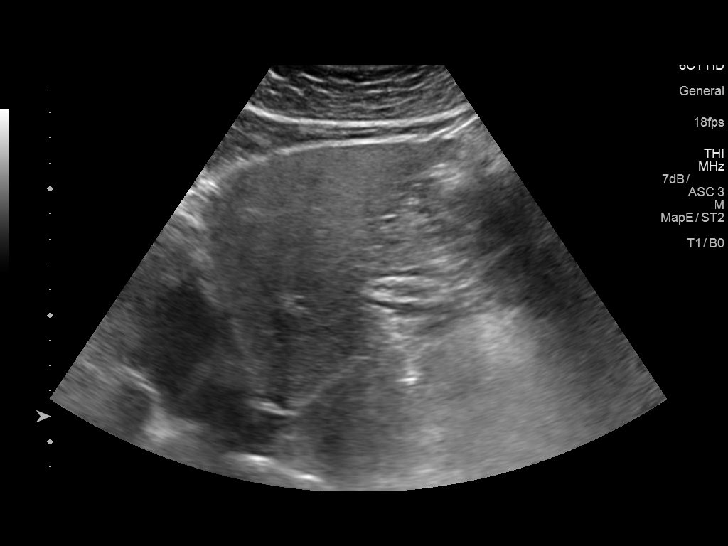
[im 22/41]
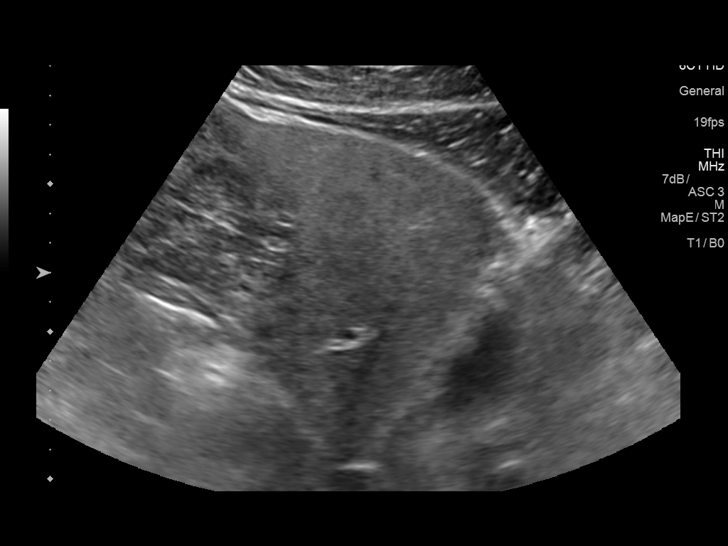
[im 26/41]
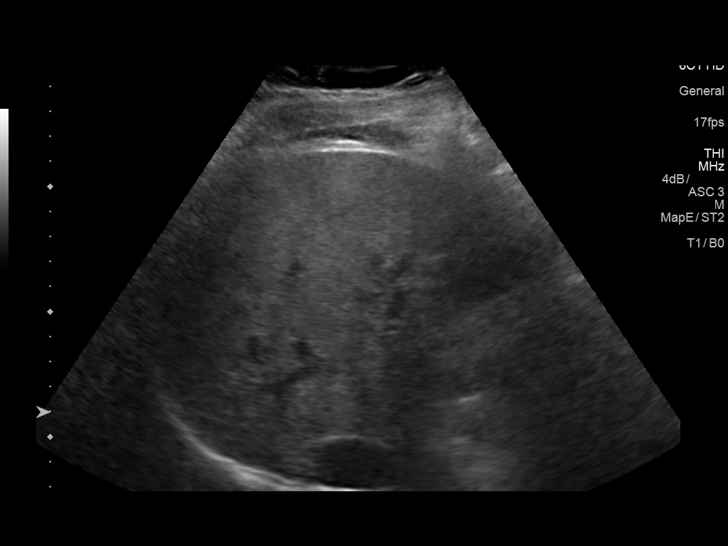
[im 27/41]
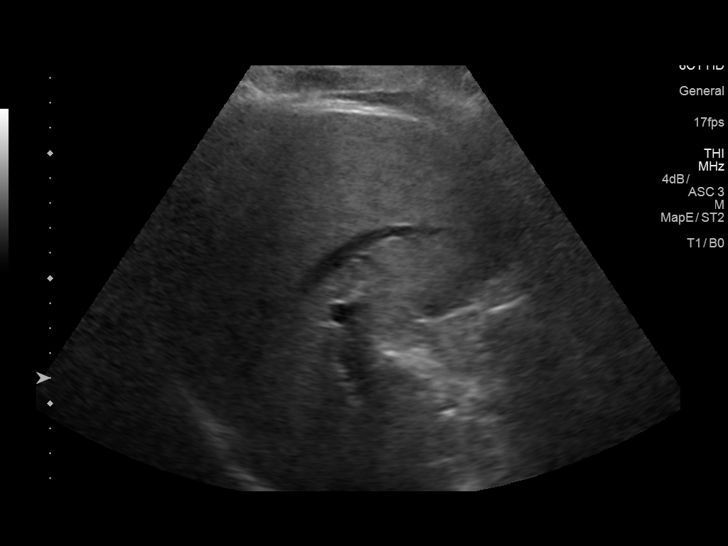
[im 31/41]
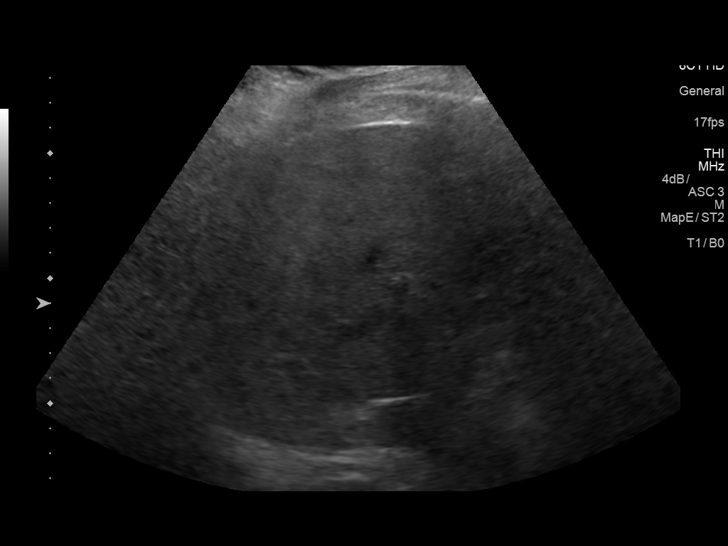
[im 34/41]
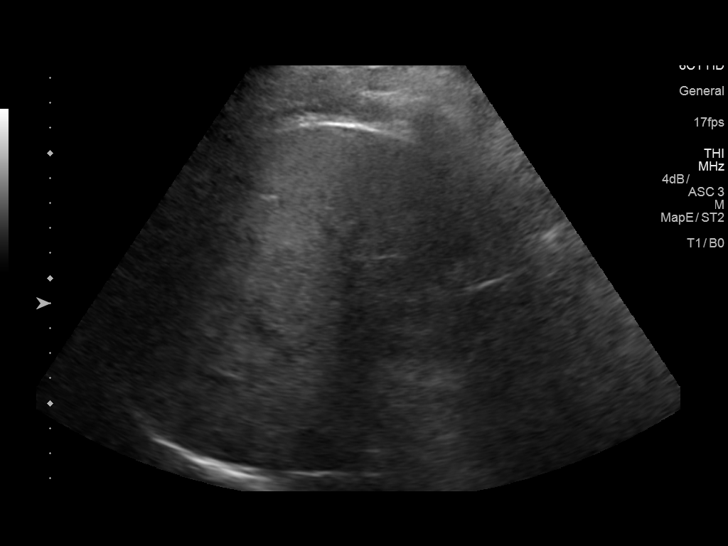
[im 37/41]
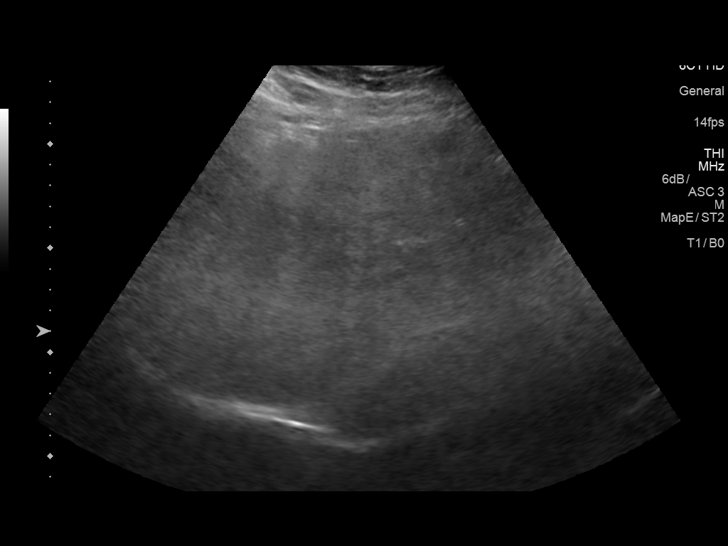
[im 41/41]
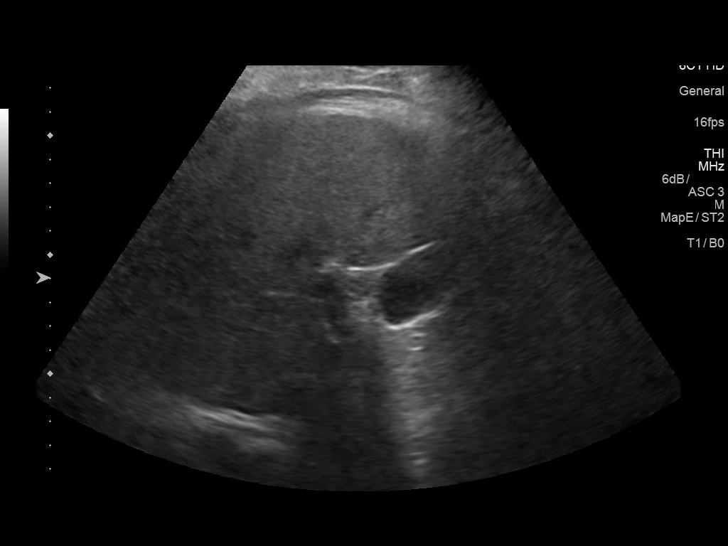

[14 of 25 positions shown; findings below may reference images not displayed]

FINDINGS: Gallbladder:

No gallstones or wall thickening visualized. No sonographic Murphy
sign noted by sonographer.

Common bile duct:

Diameter: 3.7 mm

Liver:

No focal lesion identified. Slightly increased heterogeneous
parenchymal echogenicity. Portal vein is patent on color Doppler
imaging with normal direction of blood flow towards the liver.
IMPRESSION: Slightly increased and heterogeneous parenchymal echogenicity of the
liver, consistent with liver fibrosis.

Normal appearance of the gallbladder.

## 2022-09-25 ENCOUNTER — Encounter: Payer: Self-pay | Admitting: Internal Medicine

## 2022-09-25 ENCOUNTER — Ambulatory Visit: Payer: BC Managed Care – PPO | Admitting: Internal Medicine

## 2022-09-25 VITALS — BP 150/96 | HR 74 | Temp 97.9°F | Ht 72.75 in | Wt 310.0 lb

## 2022-09-25 DIAGNOSIS — Z119 Encounter for screening for infectious and parasitic diseases, unspecified: Secondary | ICD-10-CM

## 2022-09-25 DIAGNOSIS — Z1211 Encounter for screening for malignant neoplasm of colon: Secondary | ICD-10-CM

## 2022-09-25 DIAGNOSIS — I10 Essential (primary) hypertension: Secondary | ICD-10-CM | POA: Insufficient documentation

## 2022-09-25 DIAGNOSIS — K429 Umbilical hernia without obstruction or gangrene: Secondary | ICD-10-CM

## 2022-09-25 HISTORY — DX: Umbilical hernia without obstruction or gangrene: K42.9

## 2022-09-25 MED ORDER — TELMISARTAN 40 MG PO TABS: 40.0000 mg | ORAL_TABLET | Freq: Every day | ORAL | 3 refills | Status: DC

## 2022-09-25 MED ORDER — LOSARTAN POTASSIUM 50 MG PO TABS: 50.0000 mg | ORAL_TABLET | Freq: Every day | ORAL | 3 refills | Status: DC

## 2022-09-25 MED ORDER — BUPROPION HCL ER (XL) 150 MG PO TB24: 150.0000 mg | ORAL_TABLET | Freq: Every day | ORAL | 3 refills | Status: AC

## 2022-09-25 MED ORDER — ZEPBOUND 2.5 MG/0.5ML ~~LOC~~ SOAJ: 2.5000 mg | SUBCUTANEOUS | 3 refills | Status: DC

## 2022-09-25 MED ORDER — TOPIRAMATE 25 MG PO TABS: 25.0000 mg | ORAL_TABLET | Freq: Two times a day (BID) | ORAL | 3 refills | Status: DC

## 2022-09-25 NOTE — Assessment & Plan Note (Addendum)
Individualized Hypertension Management: Stable, NOT controlled at  BP Readings from Last 1 Encounters:  09/25/22 (!) 150/96    Goal blood pressure of less than 140/90 explained Resistant/secondary hypertension workup: not necessary in my opinion BUT get sleep study due to positive stop-bang and recommmend stop beer and lose weight   Adjust medications as follows:  Start losartan 50  Standardized hypertension counseling and management: Counseled him to limit: salt, alcohol, NSAIDS, excess body weight. Have explained risks of poor control are FUTURE stroke and heart attacks Encouragement for home blood pressure monitoring Explained Red Flag symptoms for ER: if blood pressure over 180 AND new headache, shortness of breath, confusion, or chest discomfort See AFTER VISIT SUMMARY for addition educational information provided Offered to refill meds.

## 2022-09-25 NOTE — Progress Notes (Signed)
[place]  Phone: [phone]  New patient visit  Visit Date: 09/25/2022 Patient: [name]   DOB: [birth year]   57 y.o. Male  MRN: [id number]  Today's healthcare provider: Loralee Pacas, MD  Assessment and Plan:   His main objective was to discuss hypertension.   Kellar was seen today for establish care, hernia in abdomen, discuss weight loss, hypertension and discuss shingles vaccine.  Hypertension, unspecified type Assessment & Plan: Individualized Hypertension Management: Stable, NOT controlled at  BP Readings from Last 1 Encounters:  09/25/22 (!) 150/96    Goal blood pressure of less than 140/90 explained Resistant/secondary hypertension workup: not necessary in my opinion BUT get sleep study due to positive stop-bang and recommmend stop beer and lose weight   Adjust medications as follows:  Start losartan 50  Standardized hypertension counseling and management: Counseled him to limit: salt, alcohol, NSAIDS, excess body weight. Have explained risks of poor control are FUTURE stroke and heart attacks Encouragement for home blood pressure monitoring Explained Red Flag symptoms for ER: if blood pressure over 180 AND new headache, shortness of breath, confusion, or chest discomfort See AFTER VISIT SUMMARY for addition educational information provided Offered to refill meds.   Orders: -     CBC with Differential/Platelet -     Comprehensive metabolic panel -     Hemoglobin A1c -     Losartan Potassium; Take 1 tablet (50 mg total) by mouth daily. To start , take just 25 mg daily for first week  Dispense: 90 tablet; Refill: 3 -     Telmisartan; Take 1 tablet (40 mg total) by mouth daily. To start, take just half tablet daily for 1 week Replaces losartan due to formulary  Dispense: 90 tablet; Refill: 3  Morbid obesity ([place]) Overview: We talked about weight loss and I recommend aggressive weight loss to get to a waist size of about 36 to 38  inches eventually the healthy rate is no more than 5% body weight per month which for him would be about 15 to 16 pounds a month maximum weight loss.  The best medication for this at this time is up bound so we will try to get it but insurance is likely to give his trouble I advised him that prior authorization process is the way that they are avoiding paying for this in a lot of cases at the insurance level because the pricing from the drug is too high from the manufacture for all the drugs in this class.  If you are unable to get this up bound and goes to prior Auth I went ahead and talked about Wellbutrin and topiramate which I am going to send in as a backup plan but he does not need if he is able to get this up but that is not likely  Orders: -     Lipid panel -     Zepbound; Inject 2.5 mg into the skin once a week.  Dispense: 0.5 mL; Refill: 3 -     TSH -     buPROPion HCl ER (XL); Take 1 tablet (150 mg total) by mouth daily.  Dispense: 90 tablet; Refill: 3 -     Topiramate; Take 1 tablet (25 mg total) by mouth 2 (two) times daily.  Dispense: 90 tablet; Refill: 3  Colon cancer screening -     Cologuard  Screening examination for infectious disease -     HIV Antibody (routine testing w rflx) -  Ambulatory referral to Sleep Studies  Umbilical hernia without obstruction and without gangrene Overview: Chronic without gangrene R periumbilical large  Assessment & Plan: Asymptomatic, chronic, stable Advised ignore for now unless it starts to hurt Will discuss elective surgery after optimizing weight and blood pressure        Subjective:  Patient presents today to establish care.  Chief Complaint  Patient presents with   Establish Care    Ordered blood work, last ate around 9 or 9:30am.   Hernia in abdomen    Right side, bulges when he sits up, not painful.   Discuss weight loss   Hypertension   Discuss shingles vaccine    To make a decision.  Associated with 12 beers a  week Associated with snoring STOP-Bang Score Do you snore loudly?: Yes Do you often feel tired, fatigued, or sleepy during the daytime?: no  Has anyone observed you stop breathing during sleep?: No Do you have (or are you being treated for) high blood pressure?: Yes Recent BMI (Calculated): BMI Readings from Last 1 Encounters: 09/25/22 : 41.18 kg/m  Is BMI greater than 35 kg/m2?:  Yes Age older than 55 years old?: Yes Has large neck size > 40 cm (15.7 in, large male shirt size, large male collar size > 16): Yes Gender Male?:  Yes STOP-Bang Total Score(total yes answers):  6   Hypertension This is a chronic problem. The current episode started more than 1 year ago. The problem has been gradually worsening since onset. The problem is uncontrolled. Associated symptoms include shortness of breath (intermittent). Pertinent negatives include no anxiety, blurred vision, chest pain, headaches, malaise/fatigue, neck pain, orthopnea, palpitations, peripheral edema, PND or sweats. There are no associated agents to hypertension. Risk factors for coronary artery disease include sedentary lifestyle, smoking/tobacco exposure, obesity and male gender. Past treatments include nothing. There is no history of angina, kidney disease, CAD/MI, CVA, heart failure, left ventricular hypertrophy, PVD or retinopathy. Identifiable causes of hypertension include sleep apnea.     Hasn't following with Primary Care Provider (PCP) in over 5 years, presenting to establish with main concern hypertension.   Problem-oriented charting was used to develop and update his medical history: Problem  Hypertension  Morbid Obesity (Hcc)   We talked about weight loss and I recommend aggressive weight loss to get to a waist size of about 36 to 38 inches eventually the healthy rate is no more than 5% body weight per month which for him would be about 15 to 16 pounds a month maximum weight loss.  The best medication for this at this time  is up bound so we will try to get it but insurance is likely to give his trouble I advised him that prior authorization process is the way that they are avoiding paying for this in a lot of cases at the insurance level because the pricing from the drug is too high from the manufacture for all the drugs in this class.  If you are unable to get this up bound and goes to prior Auth I went ahead and talked about Wellbutrin and topiramate which I am going to send in as a backup plan but he does not need if he is able to get this up but that is not likely   Umbilical Hernia   Chronic without gangrene R periumbilical large      Depression Screen    09/25/2022    2:14 PM 03/11/2017    8:31 AM 03/11/2017  8:29 AM  PHQ 2/9 Scores  PHQ - 2 Score 0 1 1  PHQ- 9 Score   3   No results found for any visits on 09/25/22.  The following were reviewed and entered/updated into his [facility]:  Diagnosis Date   Allergy    Arthritis    Lumbar Spine. Diagnosed around 15 years ago per patient.    Environmental allergies    GERD (gastroesophageal reflux disease)    History of chickenpox    History of hepatitis C 1999   Treatment in 2000-2001. Ribovarin and Interferon injections. Late 2001 - Test of Cure   Hypertension    Umbilical hernia 95/18/8416   Chronic without gangrene R periumbilical large   Past Surgical History:  Procedure Laterality Date   I & D EXTREMITY Right 10/18/2016   Procedure: IRRIGATION AND DEBRIDEMENT OPEN FRACTURE PINNING OF THUMB, REPAIR OF NAILBED AND KNUCKLE ABRASIONS;  Surgeon: Leanora Cover, MD;  Location: [facility];  Service: Orthopedics;  Laterality: Right;   VASECTOMY     Family History  Problem Relation Age of Onset   Hypertension Mother    Arthritis Mother    Cancer Mother        Breast   Stroke Mother    Hypertension Father    Arthritis Father    Healthy Father    Healthy Sister    Hypertension Maternal Uncle    Dementia Maternal Uncle     Arthritis Maternal Grandmother    Alzheimer's disease Maternal Grandmother    Heart disease Maternal Grandfather    Cancer Maternal Grandfather    Arthritis Maternal Grandfather    Hypertension Maternal Grandfather    Alzheimer's disease Maternal Grandfather    Heart attack Maternal Grandfather    Arthritis Paternal Grandmother    Hypertension Paternal Grandfather    Arthritis Paternal Grandfather    Outpatient Medications Prior to Visit  Medication Sig Dispense Refill   Ibuprofen (ADVIL PO) Take 1 tablet by mouth as needed.     UNABLE TO FIND Take 1 tablet by mouth daily. Alieve     No facility-administered medications prior to visit.    Allergies  Allergen Reactions   Pollen Extract Shortness Of Breath and Swelling    Grass    Social History   Tobacco Use   Smoking status: Former    Types: Cigars    Quit date: 09/15/2012    Years since quitting: 10.0   Smokeless tobacco: Never  Vaping Use   Vaping Use: Never used  Substance Use Topics   Alcohol use: Yes    Alcohol/week: 12.0 standard drinks of alcohol    Types: 12 Cans of beer per week    Comment: 10-12 Beers per week   Drug use: Not Currently    Types: Marijuana    Immunization History  Administered Date(s) Administered   Hep A / Hep B 05/07/2017, 06/08/2017, 12/07/2017   Influenza-Unspecified 05/20/2017   Tdap 10/18/2016    Objective:  BP (!) 150/96 (BP Location: Left Arm, Patient Position: Sitting)   Pulse 74   Temp 97.9 F (36.6 C) (Temporal)   Ht 6' 0.75" (1.848 m)   Wt (!) 310 lb (140.6 kg)   SpO2 95%   BMI 41.18 kg/m  Body mass index is 41.18 kg/m. indicates this is an Severely obese male , but waist circumference is a better indicator of healthy body composition.   Vital signs reviewed.  Nursing notes reviewed. General Appearance/Constitutional:  polite male in no  acute distress Musculoskeletal: All extremities are intact.  Neurological:  Awake, alert,  No obvious focal neurological deficits or  cognitive impairments Psychiatric:  Appropriate mood, pleasant demeanor Problem-specific findings:  morbid obese    Results Reviewed: Results for orders placed or performed in visit on 03/13/17  Hepatitis, Acute  Result Value Ref Range   Hepatitis B Surface Ag NEGATIVE NEGATIVE   HCV Ab REACTIVE (A) NEGATIVE   Hep B C IgM NON REACTIVE NON REACTIVE   Hep A IgM NON REACTIVE NON REACTIVE  Hepatitis C RNA quantitative  Result Value Ref Range   HCV Quantitative 9,680,000 (H) NOT DETECTED IU/mL   HCV Quantitative Log 6.99 (H) NOT DETECTED Log IU/mL

## 2022-09-25 NOTE — Patient Instructions (Signed)
It was a pleasure seeing you today!  Your health and satisfaction are my top priorities. If you believe your experience today was worthy of a 5-star rating, I'd be grateful for your feedback! Loralee Pacas, MD   CHECKOUT CHECKLIST  '[]'$    Schedule next appointment(s):    Return in about 2 weeks (around 10/09/2022).  Any requested lab visits should be scheduled as appointments too  If you are not doing well:  Return to the office sooner Please bring all your medicine bottles to each appointment If your condition begins to worsen or become severe:  go to the emergency room or even call 911  '[]'$    Sign release of information authorizations: Any records we need for your care and to be your medical home  '[]'$    Wanting to remember today is just to check your blood pressures with the new blood pressure medicine and start watching your waist circumference with the new weight loss medications I hope you can get Zepbound but I doubt that is going to happen  '[]'$   (Optional):  Review your clinical notes on MyChart after they are completed.     Today's draft of the physician documented plan for today's visit: (final revisions will be visible on MyChart chart later) Colon cancer screening  Preventative health care  Screening examination for infectious disease  Hypertension, unspecified type  Morbid obesity ([name])     QUESTIONS & CONCERNS: CLINICAL: please contact us via phone [phone] OR MyChart messaging  LAB & IMAGING:   We will call you if the results are significantly abnormal or you don't use MyChart.  Most normal results will be posted to MyChart immediately and have a clinical review message by Dr. Randol Kern posted within 2-3 business days.   If you have not heard from [place] regarding the results in 2 weeks OR if you need priority reporting, please contact this office. MYCHART:  The fastest way to get your results and easiest way to stay in touch with [place] is by activating your My Chart  account. Instructions are located on the last page of this paperwork.  BILLING: xray and lab orders are billed from separate companies and questions./concerns should be directed to the [facility].  For visit charges please discuss with our administrative services COMPLAINTS:  please let Dr. Randol Kern know or see the [facility], by asking at the front desk: we want you to be satisfied with every experience and we would be grateful for the opportunity to address any problems

## 2022-09-25 NOTE — Assessment & Plan Note (Signed)
Asymptomatic, chronic, stable Advised ignore for now unless it starts to hurt Will discuss elective surgery after optimizing weight and blood pressure

## 2022-09-26 LAB — CBC WITH DIFFERENTIAL/PLATELET
Basophils Absolute: 0.1 10*3/uL (ref 0.0–0.1)
Basophils Relative: 0.9 % (ref 0.0–3.0)
Eosinophils Absolute: 0.2 10*3/uL (ref 0.0–0.7)
Eosinophils Relative: 2 % (ref 0.0–5.0)
HCT: 48.1 % (ref 39.0–52.0)
Hemoglobin: 16.5 g/dL (ref 13.0–17.0)
Lymphocytes Relative: 16.5 % (ref 12.0–46.0)
Lymphs Abs: 1.5 10*3/uL (ref 0.7–4.0)
MCHC: 34.2 g/dL (ref 30.0–36.0)
MCV: 94.1 fl (ref 78.0–100.0)
Monocytes Absolute: 0.8 10*3/uL (ref 0.1–1.0)
Monocytes Relative: 8.6 % (ref 3.0–12.0)
Neutro Abs: 6.5 10*3/uL (ref 1.4–7.7)
Neutrophils Relative %: 72 % (ref 43.0–77.0)
Platelets: 176 10*3/uL (ref 150.0–400.0)
RBC: 5.12 Mil/uL (ref 4.22–5.81)
RDW: 12.9 % (ref 11.5–15.5)
WBC: 9 10*3/uL (ref 4.0–10.5)

## 2022-09-26 LAB — COMPREHENSIVE METABOLIC PANEL
ALT: 30 U/L (ref 0–53)
AST: 29 U/L (ref 0–37)
Albumin: 4.6 g/dL (ref 3.5–5.2)
Alkaline Phosphatase: 53 U/L (ref 39–117)
BUN: 13 mg/dL (ref 6–23)
CO2: 26 mEq/L (ref 19–32)
Calcium: 9.4 mg/dL (ref 8.4–10.5)
Chloride: 101 mEq/L (ref 96–112)
Creatinine, Ser: 1.06 mg/dL (ref 0.40–1.50)
GFR: 79.42 mL/min (ref >60.00)
Glucose, Bld: 76 mg/dL (ref 70–99)
Potassium: 4.5 mEq/L (ref 3.5–5.1)
Sodium: 137 mEq/L (ref 135–145)
Total Bilirubin: 0.6 mg/dL (ref 0.2–1.2)
Total Protein: 7.5 g/dL (ref 6.0–8.3)

## 2022-09-26 LAB — TSH: TSH: 2.92 u[IU]/mL (ref 0.35–5.50)

## 2022-09-26 LAB — LIPID PANEL
Cholesterol: 171 mg/dL (ref 0–200)
HDL: 36 mg/dL — ABNORMAL LOW (ref >39.00)
LDL Cholesterol: 109 mg/dL — ABNORMAL HIGH (ref 0–99)
NonHDL: 134.94
Total CHOL/HDL Ratio: 5
Triglycerides: 131 mg/dL (ref 0.0–149.0)
VLDL: 26.2 mg/dL (ref 0.0–40.0)

## 2022-09-26 LAB — HEMOGLOBIN A1C: Hgb A1c MFr Bld: 5.4 % (ref 4.6–6.5)

## 2022-10-09 ENCOUNTER — Encounter: Payer: Self-pay | Admitting: Internal Medicine

## 2022-10-09 ENCOUNTER — Ambulatory Visit (INDEPENDENT_AMBULATORY_CARE_PROVIDER_SITE_OTHER): Payer: BC Managed Care – PPO | Admitting: Internal Medicine

## 2022-10-09 VITALS — BP 122/79 | HR 63 | Temp 97.9°F | Ht 72.75 in | Wt 312.2 lb

## 2022-10-09 DIAGNOSIS — I10 Essential (primary) hypertension: Secondary | ICD-10-CM

## 2022-10-09 DIAGNOSIS — R5383 Other fatigue: Secondary | ICD-10-CM

## 2022-10-09 MED ORDER — TOPIRAMATE 25 MG PO TABS: 25.0000 mg | ORAL_TABLET | Freq: Three times a day (TID) | ORAL | 3 refills | Status: DC

## 2022-10-09 MED ORDER — LOSARTAN POTASSIUM 25 MG PO TABS: 25.0000 mg | ORAL_TABLET | Freq: Two times a day (BID) | ORAL | 3 refills | Status: DC

## 2022-10-09 NOTE — Assessment & Plan Note (Addendum)
Normalized with losartan 50 not able to get telmisartan for easy once daily dosing so we are going to do losartan as 25 mg twice daily and see how blood pressure does with that since losartan does not really last 24 hours except at the high doses Goal 135/85 nondiabetic Encouraged weight loss  Encouraged salt and alcohol avoidance

## 2022-10-09 NOTE — Progress Notes (Signed)
[name]: [phone]   Routine Medical Office Visit  Patient:  [name]      Age: 57 y.o.       Sex:  male  Date:   10/09/2022  PCP:    [name], [place] Provider: Loralee Pacas, MD   Problem Focused Charting:   Medical Decision Making per Assessment/Plan   [name] was seen today for 2 week follow-up, hypertension and obesity.  Hypertension, unspecified type Assessment & Plan: Normalized with losartan 50 not able to get telmisartan for easy once daily dosing so we are going to do losartan as 25 mg twice daily and see how blood pressure does with that since losartan does not really last 24 hours except at the high doses Goal 135/85 nondiabetic Encouraged weight loss  Encouraged salt and alcohol avoidance   Orders: -     Losartan Potassium; Take 1 tablet (25 mg total) by mouth in the morning and at bedtime.  Dispense: 180 tablet; Refill: 3  Morbid obesity ([facility]) Overview: Goal waist: 36" Goal weight loss: no greater 15 pounds / month Goal: encouraged continuing with no sugar in drinks Zepbound: 1K$ Takes topiramate/wellbutrin Resistance training: not yet   Assessment & Plan: I encouraged him to to get this up savings card and see if he can get it affordably still because that would be awesome he is worried about losing weight too fast so we talked about the 15 pound limit per month but since he is not losing weight with the other meds yet and he would just get the low-dose up bound I do not think he would lose at that fast. Also talked about how losing weight without doing muscle training just causes muscle loss and worsens her health so encouraged him strongly to get some resistance training bands or other muscle building tools will keep doing that even if he is not dieting that is important.  Encouraged continuing with wellbutrin and topiramate  Wt Readings from Last 10 Encounters:  10/09/22 (!) 312 lb 3.2 oz (141.6  kg)  09/25/22 (!) 310 lb (140.6 kg)  03/11/17 (!) 301 lb (136.5 kg)  10/18/16 300 lb (136.1 kg)   I explained how calories are far less important than resistance training and muscle building because that increases the millet metabolic rate and lets somebody get away with any amount of calories if it is all being converted into muscle.  Will check testosterone levels to see if this is contributing to the problem  Orders: -     Basic metabolic panel -     Testosterone -     Topiramate; Take 1 tablet (25 mg total) by mouth in the morning, at noon, and at bedtime.  Dispense: 270 tablet; Refill: 3  Other fatigue        Subjective - Clinical Presentation:   [name] is a 57 y.o. 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 Diagnosed around 15 years ago per patient.    Environmental allergies    GERD (gastroesophageal reflux disease)    History of chickenpox    History of hepatitis C  1999   Treatment in 2000-2001. Ribovarin and Interferon injections. Late 2001 - Test of Cure   Hypertension    Umbilical hernia 35/36/1443   Chronic without gangrene R periumbilical large    Outpatient Medications Prior to Visit  Medication Sig   buPROPion (WELLBUTRIN XL) 150 MG 24 hr tablet Take 1 tablet (150 mg total) by mouth daily.   Ibuprofen (ADVIL PO) Take 1 tablet by mouth as needed.   losartan (COZAAR) 50 MG tablet Take 1 tablet (50 mg total) by mouth daily. To start , take just 25 mg daily for first week   topiramate (TOPAMAX) 25 MG tablet Take 1 tablet (25 mg total) by mouth 2 (two) times daily.   UNABLE TO FIND Take 1 tablet by mouth daily. Alieve   [DISCONTINUED] telmisartan  (MICARDIS) 40 MG tablet Take 1 tablet (40 mg total) by mouth daily. To start, take just half tablet daily for 1 week Replaces losartan due to formulary   [DISCONTINUED] tirzepatide (ZEPBOUND) 2.5 MG/0.5ML Pen Inject 2.5 mg into the skin once a week.   No facility-administered medications prior to visit.    Chief Complaint  Patient presents with   2 Week Follow-up    Never did get the telmisartan that was to replace the losartan due to formulary.   Hypertension   Obesity    HPI  Couldn't get zepbound or telmisartan via insurance No side effect(s) with blood pressure medication and compliant with losartan 50 now No weight loss or exercise yet, but taking wellbutrin/topiramate now without side effect(s) - helped him to dislike and stop beer changing flavor.         Objective:  Physical Exam  BP 122/79 (BP Location: Right Arm, Patient Position: Sitting)   Pulse 63   Temp 97.9 F (36.6 C) (Temporal)   Ht 6' 0.75" (1.848 m)   Wt (!) 312 lb 3.2 oz (141.6 kg)   SpO2 99%   BMI 41.47 kg/m  Severely obese  by BMI criteria but truncal adiposity (waist circumference or caliper) should be used instead. Wt Readings from Last 10 Encounters:  10/09/22 (!) 312 lb 3.2 oz (141.6 kg)  09/25/22 (!) 310 lb (140.6 kg)  03/11/17 (!) 301 lb (136.5 kg)  10/18/16 300 lb (136.1 kg)   Vital signs reviewed.  Nursing notes reviewed. Weight trend reviewed. General Appearance:  Well developed, well nourished male in no acute distress.   Normal work of breathing at rest Musculoskeletal: All extremities are intact.  Neurological:  Awake, alert,  No obvious focal neurological deficits or cognitive impairments Psychiatric:  Appropriate mood, pleasant demeanor Problem-specific findings:  checked waist cirumference in an axillary vertical section at level of umbilicus with tape measure 54"   Results Reviewed: No results found for any visits on 10/09/22.  Recent Results (from the past 2160 hour(s))  CBC  w/Diff     Status: None   Collection Time: 09/25/22  3:13 PM  Result Value Ref Range   WBC 9.0 4.0 - 10.5 K/uL   RBC 5.12 4.22 - 5.81 Mil/uL   Hemoglobin 16.5 13.0 - 17.0 g/dL   HCT 48.1 39.0 - 52.0 %   MCV 94.1 78.0 - 100.0 fl   MCHC 34.2 30.0 - 36.0 g/dL   RDW 12.9 11.5 - 15.5 %   Platelets 176.0 150.0 - 400.0 K/uL   Neutrophils Relative % 72.0 43.0 - 77.0 %   Lymphocytes Relative 16.5 12.0 - 46.0 %   Monocytes Relative 8.6 3.0 - 12.0 %  Eosinophils Relative 2.0 0.0 - 5.0 %   Basophils Relative 0.9 0.0 - 3.0 %   Neutro Abs 6.5 1.4 - 7.7 K/uL   Lymphs Abs 1.5 0.7 - 4.0 K/uL   Monocytes Absolute 0.8 0.1 - 1.0 K/uL   Eosinophils Absolute 0.2 0.0 - 0.7 K/uL   Basophils Absolute 0.1 0.0 - 0.1 K/uL  Comp Met (CMET)     Status: None   Collection Time: 09/25/22  3:13 PM  Result Value Ref Range   Sodium 137 135 - 145 mEq/L   Potassium 4.5 3.5 - 5.1 mEq/L   Chloride 101 96 - 112 mEq/L   CO2 26 19 - 32 mEq/L   Glucose, Bld 76 70 - 99 mg/dL   BUN 13 6 - 23 mg/dL   Creatinine, Ser 1.06 0.40 - 1.50 mg/dL   Total Bilirubin 0.6 0.2 - 1.2 mg/dL   Alkaline Phosphatase 53 39 - 117 U/L   AST 29 0 - 37 U/L   ALT 30 0 - 53 U/L   Total Protein 7.5 6.0 - 8.3 g/dL   Albumin 4.6 3.5 - 5.2 g/dL   GFR 79.42 >60.00 mL/min    Comment: Calculated using the CKD-EPI Creatinine Equation (2021)   Calcium 9.4 8.4 - 10.5 mg/dL  Lipid panel     Status: Abnormal   Collection Time: 09/25/22  3:13 PM  Result Value Ref Range   Cholesterol 171 0 - 200 mg/dL    Comment: ATP III Classification       Desirable:  < 200 mg/dL               Borderline High:  200 - 239 mg/dL          High:  > = 240 mg/dL   Triglycerides 131.0 0.0 - 149.0 mg/dL    Comment: Normal:  <150 mg/dLBorderline High:  150 - 199 mg/dL   HDL 36.00 (L) >39.00 mg/dL   VLDL 26.2 0.0 - 40.0 mg/dL   LDL Cholesterol 109 (H) 0 - 99 mg/dL   Total CHOL/HDL Ratio 5     Comment:                Men          Women1/2 Average Risk     3.4           3.3Average Risk          5.0          4.42X Average Risk          9.6          7.13X Average Risk          15.0          11.0                       NonHDL 134.94     Comment: NOTE:  Non-HDL goal should be 30 mg/dL higher than patient's LDL goal (i.e. LDL goal of < 70 mg/dL, would have non-HDL goal of < 100 mg/dL)  HgB A1c     Status: None   Collection Time: 09/25/22  3:13 PM  Result Value Ref Range   Hgb A1c MFr Bld 5.4 4.6 - 6.5 %    Comment: Glycemic Control Guidelines for People with Diabetes:Non Diabetic:  <6%Goal of Therapy: <7%Additional Action Suggested:  >8%   TSH     Status: None   Collection Time: 09/25/22  3:13 PM  Result Value  Ref Range   TSH 2.92 0.35 - 5.50 uIU/mL          Signed: Loralee Pacas, MD 10/09/2022 3:13 PM

## 2022-10-09 NOTE — Assessment & Plan Note (Signed)
I encouraged him to to get this up savings card and see if he can get it affordably still because that would be awesome he is worried about losing weight too fast so we talked about the 15 pound limit per month but since he is not losing weight with the other meds yet and he would just get the low-dose up bound I do not think he would lose at that fast. Also talked about how losing weight without doing muscle training just causes muscle loss and worsens her health so encouraged him strongly to get some resistance training bands or other muscle building tools will keep doing that even if he is not dieting that is important.  Encouraged continuing with wellbutrin and topiramate  Wt Readings from Last 10 Encounters:  10/09/22 (!) 312 lb 3.2 oz (141.6 kg)  09/25/22 (!) 310 lb (140.6 kg)  03/11/17 (!) 301 lb (136.5 kg)  10/18/16 300 lb (136.1 kg)   I explained how calories are far less important than resistance training and muscle building because that increases the millet metabolic rate and lets somebody get away with any amount of calories if it is all being converted into muscle.  Will check testosterone levels to see if this is contributing to the problem

## 2022-10-10 LAB — BASIC METABOLIC PANEL
BUN: 14 mg/dL (ref 6–23)
CO2: 23 mEq/L (ref 19–32)
Calcium: 9.2 mg/dL (ref 8.4–10.5)
Chloride: 106 mEq/L (ref 96–112)
Creatinine, Ser: 1.06 mg/dL (ref 0.40–1.50)
GFR: 79.4 mL/min (ref >60.00)
Glucose, Bld: 80 mg/dL (ref 70–99)
Potassium: 4.3 mEq/L (ref 3.5–5.1)
Sodium: 138 mEq/L (ref 135–145)

## 2022-10-10 LAB — TESTOSTERONE: Testosterone: 237.63 ng/dL — ABNORMAL LOW (ref 300.00–890.00)

## 2022-10-11 NOTE — Progress Notes (Signed)
Please order a future repeat testosterone level. And help him schedule a lab visit for it @ 8-10a

## 2022-10-14 ENCOUNTER — Ambulatory Visit: Payer: BC Managed Care – PPO | Admitting: Neurology

## 2022-10-14 ENCOUNTER — Encounter: Payer: Self-pay | Admitting: Neurology

## 2022-10-14 ENCOUNTER — Encounter: Payer: Self-pay | Admitting: Internal Medicine

## 2022-10-14 VITALS — BP 172/103 | HR 68 | Ht 74.0 in | Wt 308.0 lb

## 2022-10-14 DIAGNOSIS — E669 Obesity, unspecified: Secondary | ICD-10-CM | POA: Diagnosis not present

## 2022-10-14 DIAGNOSIS — Z820 Family history of epilepsy and other diseases of the nervous system: Secondary | ICD-10-CM

## 2022-10-14 DIAGNOSIS — G4719 Other hypersomnia: Secondary | ICD-10-CM | POA: Diagnosis not present

## 2022-10-14 DIAGNOSIS — Z9189 Other specified personal risk factors, not elsewhere classified: Secondary | ICD-10-CM

## 2022-10-14 DIAGNOSIS — R030 Elevated blood-pressure reading, without diagnosis of hypertension: Secondary | ICD-10-CM

## 2022-10-14 DIAGNOSIS — R351 Nocturia: Secondary | ICD-10-CM

## 2022-10-14 DIAGNOSIS — R0683 Snoring: Secondary | ICD-10-CM

## 2022-10-14 NOTE — Progress Notes (Signed)
Subjective:    Patient ID: [name] is a 57 y.o. male.  HPI    Star Age, MD, PhD [facility] [address], [address] [address] [place], [place] [address]  Dear Dr. Randol Kern,  I saw your patient, [name], upon your kind request in my sleep clinic today for initial consultation of his sleep disorder, in particular, concern for underlying obstructive sleep apnea.  The patient is unaccompanied today.  As you know, [name] is a 57 year old male with an underlying medical history of hypertension, umbilical hernia, allergies, arthritis, reflux disease, history of hepatitis C, treated in 2000 through 04/2000, and severe obesity, who reports snoring and difficulty with blood pressure control, as well as a family history of sleep apnea.  His Epworth sleepiness score is 3 out of 24 today, fatigue severity score is 22 out of 63.  He is working on weight loss and has lost a few pounds while on Wellbutrin and Topamax. Zepbound was not approved by his insurance.  He reports a family history of sleep apnea affecting his dad who is currently not on a CPAP machine and his paternal uncle uses a CPAP machine.  Patient denies recurrent morning headaches or nocturnal headaches but has nocturia about once per average night.  He sleeps for a prolonged period of time even as long as 10 hours.  Bedtime is generally between 8 and 9 PM and rise time between 6 and 7 AM.  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  He lives at home with his wife and daughter.  They have 2 dogs in the household, one of them sleeps on the bed with them.  He does have a TV in the bedroom but does not typically have it on at night.  He drinks caffeine in the form of coffee, usually 1 cup in the morning and about 2 to 3 glasses of unsweetened tea.  He does not drink any soda on a regular basis.  He is not a cigarette smoker and no longer smokes marijuana or cigars.  He drinks alcohol a few times a  week, maybe 3 beers per week and has been reducing secondary to taste change with Topamax.  I reviewed your office note from 09/25/2022.  He had a recent change in his losartan to 25 mg twice daily, he was previously on 50 mg once daily.  He checks his blood pressure at home at times, it does tend to run higher.  He did take the losartan this morning.   His Past Medical History Is Significant For: Past Medical History:  Diagnosis Date   Allergy    Arthritis    Lumbar Spine. Diagnosed around 15 years ago per patient.    Environmental allergies    GERD (gastroesophageal reflux disease)    History of chickenpox    History of hepatitis C 1999   Treatment in 2000-2001. Ribovarin and Interferon injections. Late 2001 - Test of Cure   Hypertension    Umbilical hernia 50/38/8828   Chronic without gangrene R periumbilical large    His Past Surgical History Is Significant For: Past Surgical History:  Procedure Laterality Date   I & D EXTREMITY Right 10/18/2016   Procedure: IRRIGATION AND DEBRIDEMENT OPEN FRACTURE PINNING OF THUMB, REPAIR OF NAILBED AND KNUCKLE ABRASIONS;  Surgeon: Leanora Cover, MD;  Location: [place];  Service: Orthopedics;  Laterality: Right;   VASECTOMY      His Family History Is Significant For: Family History  Problem Relation Age of Onset   Hypertension Mother    Arthritis Mother    Cancer Mother        Breast   Stroke Mother    Hypertension Father    Arthritis Father    Healthy Father    Sleep apnea Father    Healthy Sister    Hypertension Maternal Uncle    Dementia Maternal Uncle    Sleep apnea Paternal Uncle    Arthritis Maternal Grandmother    Alzheimer's disease Maternal Grandmother    Heart disease Maternal Grandfather    Cancer Maternal Grandfather    Arthritis Maternal Grandfather    Hypertension Maternal Grandfather    Alzheimer's disease Maternal Grandfather    Heart attack Maternal Grandfather    Arthritis Paternal Grandmother    Hypertension  Paternal Grandfather    Arthritis Paternal Grandfather     His Social History Is Significant For: Social History   Socioeconomic History   Marital status: Married    Spouse name: Not on file   Number of children: Not on file   Years of education: Not on file   Highest education level: Not on file  Occupational History   Not on file  Tobacco Use   Smoking status: Former    Types: Cigars    Quit date: 09/15/2012    Years since quitting: 10.0   Smokeless tobacco: Never  Vaping Use   Vaping Use: Never used  Substance and Sexual Activity   Alcohol use: Yes    Alcohol/week: 3.0 standard drinks of alcohol    Types: 3 Cans of beer per week    Comment: 10-12 Beers per week   Drug use: Not Currently    Types: Marijuana   Sexual activity: Not Currently    Birth control/protection: Surgical    Comment: vasectomy  Other Topics Concern   Not on file  Social History Narrative   Not on file   Social Determinants of Health   Financial Resource Strain: Not on file  Food Insecurity: Not on file  Transportation Needs: Not on file  Physical Activity: Not on file  Stress: Not on file  Social Connections: Not on file    His Allergies Are:  Allergies  Allergen Reactions   Pollen Extract Shortness Of Breath and Swelling    Grass   :   His Current Medications Are:  Outpatient Encounter Medications as of 10/14/2022  Medication Sig   buPROPion (WELLBUTRIN XL) 150 MG 24 hr tablet Take 1 tablet (150 mg total) by mouth daily.   Ibuprofen (ADVIL PO) Take 1 tablet by mouth as needed.   losartan (COZAAR) 25 MG tablet Take 1 tablet (25 mg total) by mouth in the morning and at bedtime.   losartan (COZAAR) 50 MG tablet Take 1 tablet (50 mg total) by mouth daily. To start , take just 25 mg daily for first week   topiramate (TOPAMAX) 25 MG tablet Take 1 tablet (25 mg total) by mouth 2 (two) times daily.   topiramate (TOPAMAX) 25 MG tablet Take 1 tablet (25 mg total) by mouth in the morning, at  noon, and at bedtime.   UNABLE TO FIND Take 1 tablet by mouth daily. Alieve   No facility-administered encounter medications on file as of 10/14/2022.  : Review of Systems:  Out of a complete 14 point review of systems, all are reviewed and negative with the exception of these symptoms as listed below:   Review of Systems  Neurological:  Pt here for sleep consult Pt snores,some fatigue,hypertension. Pt denies headaches,sleep study,CPAP machine    ESS:3 FSS:22    Objective:  Neurological Exam  Physical Exam Physical Examination:   Vitals:   10/14/22 0829 10/14/22 0834  BP: (!) 153/104 (!) 172/103  Pulse: 74 68    General Examination: The patient is a very pleasant 57 y.o. male in no acute distress. He appears well-developed and well-nourished and well groomed.   HEENT: Normocephalic, atraumatic, pupils are equal, round and reactive to light, extraocular tracking is good without limitation to gaze excursion or nystagmus noted.  Corrective eyeglasses in place.  Hearing is grossly intact. Face is symmetric with normal facial animation. Speech is clear with no dysarthria noted. There is no hypophonia. There is no lip, neck/head, jaw or voice tremor. Neck is supple with full range of passive and active motion. There are no carotid bruits on auscultation. Oropharynx exam reveals: mild mouth dryness, adequate dental hygiene and moderate to significant airway crowding, due to small airway entry and redundant soft palate, Mallampati is class IV and tonsils and tip of uvula not fully visualized.  Neck circumference of 19 inches, no significant overbite, minimal. Tongue protrudes centrally and palate elevates symmetrically.  Chest: Clear to auscultation without wheezing, rhonchi or crackles noted.  Heart: S1+S2+0, regular and normal without murmurs, rubs or gallops noted.   Abdomen: Soft, non-tender and non-distended.  Extremities: There is no pitting edema in the distal lower  extremities bilaterally.   Skin: Warm and dry without trophic changes noted.   Musculoskeletal: exam reveals no obvious joint deformities.   Neurologically:  Mental status: The patient is awake, alert and oriented in all 4 spheres. His immediate and remote memory, attention, language skills and fund of knowledge are appropriate. There is no evidence of aphasia, agnosia, apraxia or anomia. Speech is clear with normal prosody and enunciation. Thought process is linear. Mood is normal and affect is normal.  Cranial nerves II - XII are as described above under HEENT exam.  Motor exam: Normal bulk, strength and tone is noted. There is no obvious action or resting tremor.  Fine motor skills and coordination: grossly intact.  Cerebellar testing: No dysmetria or intention tremor. There is no truncal or gait ataxia.  Sensory exam: intact to light touch in the upper and lower extremities.  Gait, station and balance: He stands easily. No veering to one side is noted. No leaning to one side is noted. Posture is age-appropriate and stance is narrow based. Gait shows normal stride length and normal pace. No problems turning are noted.   Assessment and Plan:  In summary, [name] is a very pleasant 57 y.o.-year old male with an underlying medical history of hypertension, umbilical hernia, allergies, arthritis, reflux disease, history of hepatitis C, treated in 2000 through 04/2000, and severe obesity, whose history and physical exam are concerning for sleep disordered breathing, supporting a current working diagnosis of unspecified sleep apnea, with the main differential diagnoses of obstructive sleep apnea (OSA) versus upper airway resistance syndrome (UARS) versus central sleep apnea (CSA), or mixed sleep apnea. A laboratory attended sleep study is typically considered "gold standard" for evaluation of sleep disordered breathing.   I had a long chat with the patient about my findings and the diagnosis of  sleep apnea, particularly OSA, its prognosis and treatment options. We talked about medical/conservative treatments, surgical interventions and non-pharmacological approaches for symptom control. I explained, in particular, the risks and ramifications of untreated moderate to severe  OSA, especially with respect to developing cardiovascular disease down the road, including congestive heart failure (CHF), difficult to treat hypertension, cardiac arrhythmias (particularly A-fib), neurovascular complications including TIA, stroke and dementia. Even type 2 diabetes has, in part, been linked to untreated OSA. Symptoms of untreated OSA may include (but may not be limited to) daytime sleepiness, nocturia (i.e. frequent nighttime urination), memory problems, mood irritability and suboptimally controlled or worsening mood disorder such as depression and/or anxiety, lack of energy, lack of motivation, physical discomfort, as well as recurrent headaches, especially morning or nocturnal headaches. We talked about the importance of maintaining a healthy lifestyle and striving for healthy weight. In addition, we talked about the importance of striving for and maintaining good sleep hygiene. I recommended a sleep study at this time. I outlined the differences between a laboratory attended sleep study which is considered more comprehensive and accurate over the option of a home sleep test (HST); the latter may lead to underestimation of sleep disordered breathing in some instances and does not help with diagnosing upper airway resistance syndrome and is not accurate enough to diagnose primary central sleep apnea typically. I outlined possible surgical and non-surgical treatment options of OSA, including the use of a positive airway pressure (PAP) device (i.e. CPAP, AutoPAP/APAP or BiPAP in certain circumstances), a custom-made dental device (aka oral appliance, which would require a referral to a specialist dentist or orthodontist  typically, and is generally speaking not considered for patients with full dentures or edentulous state), upper airway surgical options, such as traditional UPPP (which is not considered a first-line treatment) or the Inspire device (hypoglossal nerve stimulator, which would involve a referral for consultation with an ENT surgeon, after careful selection, following inclusion criteria - also not first-line treatment). I explained the PAP treatment option to the patient in detail, as this is generally considered first-line treatment.  The patient indicated that he would be willing to try PAP therapy, if the need arises. I explained the importance of being compliant with PAP treatment, not only for insurance purposes but primarily to improve patient's symptoms symptoms, and for the patient's long term health benefit, including to reduce His cardiovascular risks longer-term.  His blood pressure was elevated today, even after recheck; he denies any associated Sx, such as HA, SOB, CP, blurry vision.  He is advised to touch base with you regarding his elevated blood pressure values today, he takes losartan 25 mg twice daily as of this week.  We will pick up our discussion about the next steps and treatment options after testing.  We will keep him posted as to the test results by phone call and/or MyChart messaging where possible.  We will plan to follow-up in sleep clinic accordingly as well.  I answered all his questions today and the patient was in agreement.   I encouraged him to call with any interim questions, concerns, problems or updates or email [place] through [place].  Generally speaking, sleep test authorizations may take up to 2 weeks, sometimes less, sometimes longer, the patient is encouraged to get in touch with [place] if they do not hear back from the sleep lab staff directly within the next 2 weeks.  Thank you very much for allowing me to participate in the care of this nice patient. If I can be of any  further assistance to you please do not hesitate to call me at [phone].  Sincerely,   Star Age, MD, PhD

## 2022-10-14 NOTE — Patient Instructions (Signed)

## 2022-10-15 ENCOUNTER — Other Ambulatory Visit: Payer: Self-pay

## 2022-10-15 DIAGNOSIS — R7989 Other specified abnormal findings of blood chemistry: Secondary | ICD-10-CM

## 2022-10-15 NOTE — Telephone Encounter (Signed)
Patient requests to be called at ph# [phone] to be advised re: MyChart message dated 10/14/22

## 2022-10-17 ENCOUNTER — Other Ambulatory Visit (INDEPENDENT_AMBULATORY_CARE_PROVIDER_SITE_OTHER): Payer: BC Managed Care – PPO

## 2022-10-17 DIAGNOSIS — R7989 Other specified abnormal findings of blood chemistry: Secondary | ICD-10-CM

## 2022-10-17 DIAGNOSIS — E291 Testicular hypofunction: Secondary | ICD-10-CM

## 2022-10-17 LAB — TESTOSTERONE: Testosterone: 297.89 ng/dL — ABNORMAL LOW (ref 300.00–890.00)

## 2022-10-26 LAB — COLOGUARD: COLOGUARD: NEGATIVE

## 2022-10-27 NOTE — Progress Notes (Signed)
Notify patient if not seen in myChart: Cologuard testing was Negative, this is great news.  My usual recommendation for future testing is: Cologuard testing once every 3 years from age 55 to 66 years unless you have a particularly concerning family history of colon cancer, but we can do it more frequently if you like.  Loralee Pacas, MD  10/27/2022 8:11 AM

## 2022-11-11 ENCOUNTER — Ambulatory Visit: Payer: BC Managed Care – PPO | Admitting: Neurology

## 2022-11-11 DIAGNOSIS — Z820 Family history of epilepsy and other diseases of the nervous system: Secondary | ICD-10-CM

## 2022-11-11 DIAGNOSIS — R351 Nocturia: Secondary | ICD-10-CM

## 2022-11-11 DIAGNOSIS — G4733 Obstructive sleep apnea (adult) (pediatric): Secondary | ICD-10-CM

## 2022-11-11 DIAGNOSIS — G4719 Other hypersomnia: Secondary | ICD-10-CM

## 2022-11-11 DIAGNOSIS — R030 Elevated blood-pressure reading, without diagnosis of hypertension: Secondary | ICD-10-CM

## 2022-11-11 DIAGNOSIS — E669 Obesity, unspecified: Secondary | ICD-10-CM

## 2022-11-11 DIAGNOSIS — R0683 Snoring: Secondary | ICD-10-CM

## 2022-11-11 DIAGNOSIS — Z9189 Other specified personal risk factors, not elsewhere classified: Secondary | ICD-10-CM

## 2022-11-12 ENCOUNTER — Encounter: Payer: Self-pay | Admitting: Internal Medicine

## 2022-11-12 ENCOUNTER — Ambulatory Visit (INDEPENDENT_AMBULATORY_CARE_PROVIDER_SITE_OTHER): Payer: BC Managed Care – PPO | Admitting: Internal Medicine

## 2022-11-12 DIAGNOSIS — J302 Other seasonal allergic rhinitis: Secondary | ICD-10-CM | POA: Diagnosis not present

## 2022-11-12 DIAGNOSIS — I10 Essential (primary) hypertension: Secondary | ICD-10-CM

## 2022-11-12 MED ORDER — OLOPATADINE HCL 0.1 % OP SOLN: 1.0000 [drp] | Freq: Two times a day (BID) | OPHTHALMIC | 12 refills | Status: DC

## 2022-11-12 MED ORDER — TOPIRAMATE ER 100 MG PO CAP24: 1.0000 | ORAL_CAPSULE | Freq: Every day | ORAL | 3 refills | Status: DC

## 2022-11-12 MED ORDER — SIMPLY SALINE 0.9 % NA AERS: 2.0000 | INHALATION_SPRAY | Freq: Every day | NASAL | 1 refills | Status: DC | PRN

## 2022-11-12 MED ORDER — FLONASE SENSIMIST 27.5 MCG/SPRAY NA SUSP: 2.0000 | Freq: Every day | NASAL | 12 refills | Status: DC

## 2022-11-12 NOTE — Assessment & Plan Note (Addendum)
Wt Readings from Last 3 Encounters:  11/12/22 (!) 302 lb 12.8 oz (137.3 kg)  10/14/22 (!) 308 lb (139.7 kg)  10/09/22 (!) 312 lb 3.2 oz (141.6 kg)  Encouraged continuing with topiramate/Wellbutrin Increase dose topiramate 50 twice daily or 100 daily depending on cost. Wellbutrin dose 150 mg will continue unchanged to minimize risk of weight loss too fast. Reassured patient current rate of loss outstanding, and rechecked weight loss by waistline [id number] at umbilicus sagittal.

## 2022-11-12 NOTE — Progress Notes (Signed)
[facility]: [id number]   Routine Medical Office Visit  Patient:  [name]      Age: 57 y.o.       Sex:  male  Date:   11/12/2022  PCP:    Loralee Pacas, [place] Provider: Loralee Pacas, MD   Problem Focused Charting:   Medical Decision Making per Assessment/Plan   Asier was seen today for 1 month follow-up and discuss allergy medication.  Morbid obesity ([place]) Overview: Goal waist: 36" Goal weight loss: no greater 15 pounds / month Goal: encouraged continuing with no sugar in drinks Zepbound: 1K$ Takes topiramate/wellbutrin Resistance training: started bands/resistance.  Assessment & Plan: Wt Readings from Last 3 Encounters:  11/12/22 (!) 302 lb 12.8 oz (137.3 kg)  10/14/22 (!) 308 lb (139.7 kg)  10/09/22 (!) 312 lb 3.2 oz (141.6 kg)  Encouraged continuing with topiramate/Wellbutrin Increase dose topiramate 50 twice daily or 100 daily depending on cost. Wellbutrin dose 150 mg will continue unchanged to minimize risk of weight loss too fast. Reassured patient current rate of loss outstanding, and rechecked weight loss by waistline [id number] at umbilicus sagittal.   Orders: -     Topiramate ER; Take 1 capsule (100 mg total) by mouth daily at 6 (six) AM.  Dispense: 90 capsule; Refill: 3  Hypertension, unspecified type Overview:     Assessment & Plan: Encouraged continuing with losartan 25 twice daily Controlled, stable Encouraged continuing with weight loss. Explained might be able to cut back on this due to healthier diet and weight loss. He self tested home sleep test last night. Encouraged patient to try dash seasoning blends.  Explained nosalt are recommended.   Seasonal allergies Overview: Nasal and eyes   Assessment & Plan: Explained that the main focus should be on keeping the sinuses clear of any allergens with the simply saline and then using Flonase for the nose allergies and Pataday for the eye  allergies as needed on an as needed only basis.  Okay to combine all of this with Benadryl Zyrtec Claritin whichever he prefers by mouth but I do not think he will need any of those  Orders: -     Olopatadine HCl; Place 1 drop into both eyes 2 (two) times daily.  Dispense: 5 mL; Refill: 12 -     Flonase Sensimist; Place 2 sprays into the nose daily.  Dispense: 10 g; Refill: 12 -     Simply Saline; Place 2 each into the nose daily as needed.  Dispense: 500 mL; Refill: 1     Subjective - Clinical Presentation:   [name] is a 57 y.o. 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 Diagnosed around 15 years ago per patient.    Environmental allergies    GERD (gastroesophageal reflux disease)    History of chickenpox    History of hepatitis C 1999   Treatment in 2000-2001. Ribovarin and Interferon injections. Late 2001 - Test of Cure   Hypertension    Umbilical hernia [id number]   Chronic without gangrene R periumbilical large    Outpatient Medications Prior  to Visit  Medication Sig   buPROPion (WELLBUTRIN XL) 150 MG 24 hr tablet Take 1 tablet (150 mg total) by mouth daily.   Ibuprofen (ADVIL PO) Take 1 tablet by mouth as needed.   losartan (COZAAR) 25 MG tablet Take 1 tablet (25 mg total) by mouth in the morning and at bedtime.   losartan (COZAAR) 50 MG tablet Take 1 tablet (50 mg total) by mouth daily. To start , take just 25 mg daily for first week   UNABLE TO FIND Take 1 tablet by mouth daily. Alieve   [DISCONTINUED] topiramate (TOPAMAX) 25 MG tablet Take 1 tablet (25 mg total) by mouth 2 (two) times daily.   [DISCONTINUED] topiramate  (TOPAMAX) 25 MG tablet Take 1 tablet (25 mg total) by mouth in the morning, at noon, and at bedtime.   No facility-administered medications prior to visit.    Chief Complaint  Patient presents with   1 month follow-up   Discuss Allergy medication    Taking zyrtec with some relief, but would like another suggestion that may provide better relief.    HPI  Home bps 120s-130s Taking losartan twice daily but that can be difficult to    Taking topiramate three times daily for weight loss, and down 10 pounds.  It is adversely impactly flavor, especially beers Has lots of topiramate so will just use what he has but agreeable to go to 50 mg twice daily.         Objective:  Physical Exam  BP 126/74 (BP Location: Left Arm, Patient Position: Sitting)   Pulse 70   Temp 97.9 F (36.6 C) (Temporal)   Ht '6\' 2"'$  (1.88 m)   Wt (!) 302 lb 12.8 oz (137.3 kg)   SpO2 97%   BMI 38.88 kg/m  Obese  by BMI criteria but truncal adiposity (waist circumference or caliper) should be used instead. [id number] waist at umbilicus, 51 @ waist Wt Readings from Last 10 Encounters:  11/12/22 (!) 302 lb 12.8 oz (137.3 kg)  10/14/22 (!) 308 lb (139.7 kg)  10/09/22 (!) 312 lb 3.2 oz (141.6 kg)  09/25/22 (!) 310 lb (140.6 kg)  03/11/17 (!) 301 lb (136.5 kg)  10/18/16 300 lb (136.1 kg)   Vital signs reviewed.  Nursing notes reviewed. Weight trend reviewed. General Appearance:  Well developed, well nourished male in no acute distress.   Normal work of breathing at rest Musculoskeletal: All extremities are intact.  Neurological:  Awake, alert,  No obvious focal neurological deficits or cognitive impairments Psychiatric:  Appropriate mood, pleasant demeanor Problem-specific findings:     Results Reviewed: No results found for any visits on 11/12/22.  Recent Results (from the past 2160 hour(s))  CBC w/Diff     Status: None   Collection Time: 09/25/22  3:13 PM  Result Value Ref Range   WBC 9.0 4.0 - 10.5 K/uL   RBC  5.12 4.22 - 5.81 Mil/uL   Hemoglobin 16.5 13.0 - 17.0 g/dL   HCT 48.1 39.0 - 52.0 %   MCV 94.1 78.0 - 100.0 fl   MCHC 34.2 30.0 - 36.0 g/dL   RDW 12.9 11.5 - 15.5 %   Platelets 176.0 150.0 - 400.0 K/uL   Neutrophils Relative % 72.0 43.0 - 77.0 %   Lymphocytes Relative 16.5 12.0 - 46.0 %   Monocytes Relative 8.6 3.0 - 12.0 %   Eosinophils Relative 2.0 0.0 - 5.0 %   Basophils Relative 0.9 0.0 - 3.0 %   Neutro Abs  6.5 1.4 - 7.7 K/uL   Lymphs Abs 1.5 0.7 - 4.0 K/uL   Monocytes Absolute 0.8 0.1 - 1.0 K/uL   Eosinophils Absolute 0.2 0.0 - 0.7 K/uL   Basophils Absolute 0.1 0.0 - 0.1 K/uL  Comp Met (CMET)     Status: None   Collection Time: 09/25/22  3:13 PM  Result Value Ref Range   Sodium 137 135 - 145 mEq/L   Potassium 4.5 3.5 - 5.1 mEq/L   Chloride 101 96 - 112 mEq/L   CO2 26 19 - 32 mEq/L   Glucose, Bld 76 70 - 99 mg/dL   BUN 13 6 - 23 mg/dL   Creatinine, Ser 1.06 0.40 - 1.50 mg/dL   Total Bilirubin 0.6 0.2 - 1.2 mg/dL   Alkaline Phosphatase 53 39 - 117 U/L   AST 29 0 - 37 U/L   ALT 30 0 - 53 U/L   Total Protein 7.5 6.0 - 8.3 g/dL   Albumin 4.6 3.5 - 5.2 g/dL   GFR 79.42 >60.00 mL/min    Comment: Calculated using the CKD-EPI Creatinine Equation (2021)   Calcium 9.4 8.4 - 10.5 mg/dL  Lipid panel     Status: Abnormal   Collection Time: 09/25/22  3:13 PM  Result Value Ref Range   Cholesterol 171 0 - 200 mg/dL    Comment: ATP III Classification       Desirable:  < 200 mg/dL               Borderline High:  200 - 239 mg/dL          High:  > = 240 mg/dL   Triglycerides 131.0 0.0 - 149.0 mg/dL    Comment: Normal:  <150 mg/dLBorderline High:  150 - 199 mg/dL   HDL 36.00 (L) >39.00 mg/dL   VLDL 26.2 0.0 - 40.0 mg/dL   LDL Cholesterol 109 (H) 0 - 99 mg/dL   Total CHOL/HDL Ratio 5     Comment:                Men          Women1/2 Average Risk     3.4          3.3Average Risk          5.0          4.42X Average Risk          9.6          7.13X Average Risk          15.0          11.0                        NonHDL 134.94     Comment: NOTE:  Non-HDL goal should be 30 mg/dL higher than patient's LDL goal (i.e. LDL goal of < 70 mg/dL, would have non-HDL goal of < 100 mg/dL)  HgB A1c     Status: None   Collection Time: 09/25/22  3:13 PM  Result Value Ref Range   Hgb A1c MFr Bld 5.4 4.6 - 6.5 %    Comment: Glycemic Control Guidelines for People with Diabetes:Non Diabetic:  <6%Goal of Therapy: <7%Additional Action Suggested:  >8%   TSH     Status: None   Collection Time: 09/25/22  3:13 PM  Result Value Ref Range   TSH 2.92 0.35 - 5.50 uIU/mL  Basic metabolic panel     Status: None  Collection Time: 10/09/22  2:52 PM  Result Value Ref Range   Sodium 138 135 - 145 mEq/L   Potassium 4.3 3.5 - 5.1 mEq/L   Chloride 106 96 - 112 mEq/L   CO2 23 19 - 32 mEq/L   Glucose, Bld 80 70 - 99 mg/dL   BUN 14 6 - 23 mg/dL   Creatinine, Ser 1.06 0.40 - 1.50 mg/dL   GFR 79.40 >60.00 mL/min    Comment: Calculated using the CKD-EPI Creatinine Equation (2021)   Calcium 9.2 8.4 - 10.5 mg/dL  Testosterone     Status: Abnormal   Collection Time: 10/09/22  2:52 PM  Result Value Ref Range   Testosterone 237.63 (L) 300.00 - 890.00 ng/dL  Cologuard     Status: None   Collection Time: 10/15/22  6:30 AM  Result Value Ref Range   COLOGUARD Negative Negative    Comment:  NEGATIVE TEST RESULT. A negative Cologuard result indicates a low likelihood that a colorectal cancer (CRC) or advanced adenoma (adenomatous polyps with more advanced pre-malignant features)  is present. The chance that a person with a negative Cologuard test has a colorectal cancer is less than 1 in 1500 (negative predictive value >99.9%) or has an  advanced adenoma is less than  5.3% (negative predictive value 94.7%). These data are based on a prospective cross-sectional study of 10,000 individuals at average risk for colorectal cancer who were screened with both Cologuard and colonoscopy. (Imperiale T. et al, N Engl J Med  2014;370(14):1286-1297) The normal value (reference range) for this assay is negative.  COLOGUARD RE-SCREENING RECOMMENDATION: Periodic colorectal cancer screening is an important part of preventive healthcare for asymptomatic individuals at average risk for colorectal cancer.  Following a negative Cologuard result, the [place] Task Force screening guidelines recommend a Cologuard re-screening interval of 3 years.  References: American Cancer Society Guideline for Colorectal Cancer Screening: https://www.cancer.org/cancer/colon-rectal-cancer/detection-diagnosis-staging/acs-recommendations.html.; Rex DK, Boland CR, Dominitz JK, Colorectal Cancer Screening: Recommendations for Physicians and Patients from the [place] Task Force on Colorectal Cancer Screening , Am J Gastroenterology 2017; N9471014.  TEST DESCRIPTION: Composite algorithmic analysis of stool DNA-biomarkers with hemoglobin immunoassay.   Quantitative values of individual biomarkers are not reportable and are not associated with individual biomarker result reference ranges. Cologuard is intended for colorectal cancer screening of adults of either sex, 38 years or older, who are at average-risk for colorectal cancer (CRC). Cologuard has been approved for use by the U.S. FDA. The performance of Cologuard was  established in a cross sectional study of average-risk adults aged 14-84. Cologuard performance in patients ages 34 to 36 years was estimated by sub-group analysis of near-age groups. Colonoscopies performed for a positive result may find as the most clinically significant lesion: colorectal cancer [4.0%], advanced adenoma (including sessile serrated polyps greater than or equal to 1cm diameter) [20%] or non- advanced adenoma [31%]; or no colorectal neoplasia [45%]. These estimates are derived from a prospective cross-sectional screening study of 10,000 individuals at average risk for  colorectal cancer who were screened with both Cologuard and colonoscopy. (Imperiale T. et al, Alison Stalling J Med 2014;370(14):1286-1297.) Cologuard may produce a false negative or false positive result (no colorectal cancer or precancerous polyp present at colonoscopy follow up). A negative Cologuard test result does not guarantee the absence of CRC or advanced adenoma (pre-cancer). The current Cologuard  screening interval is every 3 years. Paramedic and [employer] developer). Cologuard performance data in a 10,000 patient  pivotal study using colonoscopy as the reference method can be accessed at the following location: www.exactlabs.com/results. Additional description of the Cologuard test process, warnings and precautions can be found at www.cologuard.com.   Testosterone     Status: Abnormal   Collection Time: 10/17/22  8:26 AM  Result Value Ref Range   Testosterone 297.89 (L) 300.00 - 890.00 ng/dL        Signed: Loralee Pacas, MD 11/12/2022 3:00 PM

## 2022-11-12 NOTE — Assessment & Plan Note (Signed)
Encouraged continuing with losartan 25 twice daily Controlled, stable Encouraged continuing with weight loss. Explained might be able to cut back on this due to healthier diet and weight loss. He self tested home sleep test last night. Encouraged patient to try dash seasoning blends.  Explained nosalt are recommended.

## 2022-11-12 NOTE — Patient Instructions (Signed)
It was a pleasure seeing you today!  Your health and satisfaction are my top priorities. If you believe your experience today was worthy of a 5-star rating, I'd be grateful for your feedback! Loralee Pacas, MD   CHECKOUT CHECKLIST  '[]'$    Schedule next appointment(s):    No follow-ups on file. 1-6 months recommended  Any requested lab visits should be scheduled as appointments too  If you are not doing well:  Return to the office sooner Please bring all your medicine bottles to each appointment If your condition begins to worsen or become severe:  go to the emergency room or even call 911  '[]'$    Sign release of information authorizations: Any records we need for your care and to be your medical home  REMINDERS:  '[]'$    I want you to look at ways that you can increase the frequency that you use the resistance bands as you continue to take the medications at the current doses but may be go up to 50 mg twice a day on Topamax versus getting the 100 mg daily if it is affordable     '[]'$   (Optional):  Review your clinical notes on MyChart after they are completed.     Today's draft of the physician documented plan for today's visit: (final revisions will be visible on MyChart chart later) Morbid obesity ([place]) Assessment & Plan: Wt Readings from Last 3 Encounters:  11/12/22 (!) 302 lb 12.8 oz (137.3 kg)  10/14/22 (!) 308 lb (139.7 kg)  10/09/22 (!) 312 lb 3.2 oz (141.6 kg)  Encouraged continuing with topiramate/Wellbutrin Increase dose topiramate 50 twice daily or 100 daily depending on cost. Wellbutrin dose 150 mg will continue unchanged to minimize risk of weight loss too fast. Reassured patient current rate of loss outstanding, and rechecked weight loss by waistline [id number] at umbilicus sagittal.   Orders: -     Topiramate ER; Take 1 capsule (100 mg total) by mouth daily at 6 (six) AM.  Dispense: 90 capsule; Refill: 3  Hypertension, unspecified type Assessment & Plan: Encouraged  continuing with losartan 25 twice daily Controlled, stable Encouraged continuing with weight loss. Explained might be able to cut back on this due to healthier diet and weight loss. He self tested home sleep test last night. Encouraged patient to try dash seasoning blends.  Explained nosalt are recommended.   Seasonal allergies Assessment & Plan: Explained that the main focus should be on keeping the sinuses clear of any allergens with the simply saline and then using Flonase for the nose allergies and Pataday for the eye allergies as needed on an as needed only basis.  Okay to combine all of this with Benadryl Zyrtec Claritin whichever he prefers by mouth but I do not think he will need any of those  Orders: -     Olopatadine HCl; Place 1 drop into both eyes 2 (two) times daily.  Dispense: 5 mL; Refill: 12 -     Flonase Sensimist; Place 2 sprays into the nose daily.  Dispense: 10 g; Refill: 12 -     Simply Saline; Place 2 each into the nose daily as needed.  Dispense: 500 mL; Refill: 1      QUESTIONS & CONCERNS: CLINICAL: please contact us via phone [phone] OR MyChart messaging  LAB & IMAGING:   We will call you if the results are significantly abnormal or you don't use MyChart.  Most normal results will be posted to MyChart immediately and have  a clinical review message by Dr. Randol Kern posted within 2-3 business days.   If you have not heard from [place] regarding the results in 2 weeks OR if you need priority reporting, please contact this office. MYCHART:  The fastest way to get your results and easiest way to stay in touch with [place] is by activating your My Chart account. Instructions are located on the last page of this paperwork.  BILLING: xray and lab orders are billed from separate companies and questions./concerns should be directed to the [facility].  For visit charges please discuss with our administrative services COMPLAINTS:  please let Dr. Randol Kern know or see the  [facility], by asking at the front desk: we want you to be satisfied with every experience and we would be grateful for the opportunity to address any problems

## 2022-11-12 NOTE — Assessment & Plan Note (Signed)
Explained that the main focus should be on keeping the sinuses clear of any allergens with the simply saline and then using Flonase for the nose allergies and Pataday for the eye allergies as needed on an as needed only basis.  Okay to combine all of this with Benadryl Zyrtec Claritin whichever he prefers by mouth but I do not think he will need any of those

## 2022-11-13 NOTE — Progress Notes (Signed)
See procedure note.

## 2022-11-14 NOTE — Procedures (Signed)
See procedure note.

## 2022-11-14 NOTE — Addendum Note (Signed)
Addended by: Star Age on: 11/14/2022 12:51 PM   Modules accepted: Orders

## 2022-11-16 ENCOUNTER — Encounter: Payer: Self-pay | Admitting: Internal Medicine

## 2022-11-16 DIAGNOSIS — G4733 Obstructive sleep apnea (adult) (pediatric): Secondary | ICD-10-CM | POA: Insufficient documentation

## 2022-11-17 ENCOUNTER — Telehealth: Payer: Self-pay | Admitting: *Deleted

## 2022-11-17 NOTE — Telephone Encounter (Signed)
-----   Message from Star Age, MD sent at 11/14/2022 12:51 PM EST ----- Patient referred by PCP, seen by me on 10/14/2022, patient had a HST on 11/11/2022.    Please call and notify the patient that the recent home sleep test showed obstructive sleep apnea in the moderate range. I recommend treatment in the form of autoPAP, which means, that we don't have to bring him in for a sleep study with CPAP, but will let him start using a so called autoPAP machine at home, which is a CPAP-like machine with self-adjusting pressures. We will send the order to a local DME company (of his choice, or as per insurance requirement). The DME representative will fit him with a mask, educate him on how to use the machine, how to put the mask on, etc. I have placed an order in the chart. Please send the order, talk to patient, send report to referring MD. We will need a FU in sleep clinic for 10 weeks post-PAP set up, please arrange that with me or one of our NPs. Also reinforce the need for compliance with treatment. Thanks,   Star Age, MD, PhD [facility])

## 2022-11-17 NOTE — Telephone Encounter (Signed)
Called pt & LVM with office number asking for call back.

## 2022-11-20 ENCOUNTER — Telehealth: Payer: Self-pay | Admitting: *Deleted

## 2022-11-20 NOTE — Telephone Encounter (Signed)
I called pt.  No answer.wanted to give results of his sleep study.   Star Age, MD  P Gna-Pod 4 Results Patient referred by PCP, seen by me on 10/14/2022, patient had a HST on 11/11/2022.    Please call and notify the patient that the recent home sleep test showed obstructive sleep apnea in the moderate range. I recommend treatment in the form of autoPAP, which means, that we don't have to bring him in for a sleep study with CPAP, but will let him start using a so called autoPAP machine at home, which is a CPAP-like machine with self-adjusting pressures. We will send the order to a local DME company (of his choice, or as per insurance requirement). The DME representative will fit him with a mask, educate him on how to use the machine, how to put the mask on, etc. I have placed an order in the chart. Please send the order, talk to patient, send report to referring MD. We will need a FU in sleep clinic for 10 weeks post-PAP set up, please arrange that with me or one of our NPs. Also reinforce the need for compliance with treatment. Thanks,  Star Age, MD, PhD

## 2022-11-27 NOTE — Telephone Encounter (Signed)
I called patient again to discuss his sleep study results. No answer, left a message asking him to call me back. This appears to be our third unsuccessful attempt at reaching patient via phone. I will send him a mychart message.

## 2022-11-28 NOTE — Telephone Encounter (Signed)
Pt return a call from nurse. Requesting a call back to go over sleep study results

## 2022-12-01 ENCOUNTER — Encounter: Payer: Self-pay | Admitting: *Deleted

## 2022-12-01 NOTE — Telephone Encounter (Signed)
Pt called back. Requesting a call back from nurse.  

## 2022-12-01 NOTE — Telephone Encounter (Signed)
The patient and we discussed his sleep study results.  Patient verbalized understanding that his home sleep test showed OSA in the moderate range.  The patient is amenable to proceeding with AutoPap.  We discussed the insurance compliance requirements which includes using the machine at least 4 hours at night and also being seen in our office between 30 and 90 days after set up.  We discussed options for DME company.  We will go with [employer] due to close proximity to him.  He will watch for a call within 1 week.  I sent him a MyChart message with [employer]'s contact information in case he needs it.  Patient scheduled an initial follow-up appointment for May 20 at 2:30 PM as he requested an afternoon appointment.  His questions were answered and he verbalized appreciation for the call.  Referral for autopap sent to [facility].

## 2022-12-01 NOTE — Telephone Encounter (Signed)
Called pt back and LVM with office number and hours for pt to call back.

## 2022-12-02 NOTE — Telephone Encounter (Signed)
[employer] confirmed receipt of autopap referral.

## 2022-12-11 ENCOUNTER — Ambulatory Visit: Payer: BC Managed Care – PPO | Admitting: Internal Medicine

## 2023-01-12 ENCOUNTER — Ambulatory Visit: Payer: BC Managed Care – PPO | Admitting: Internal Medicine

## 2023-02-02 ENCOUNTER — Ambulatory Visit: Payer: BC Managed Care – PPO | Admitting: Adult Health

## 2023-02-11 ENCOUNTER — Ambulatory Visit: Payer: BC Managed Care – PPO | Admitting: Family Medicine

## 2023-02-12 ENCOUNTER — Ambulatory Visit: Payer: BC Managed Care – PPO | Admitting: Internal Medicine

## 2023-03-16 ENCOUNTER — Ambulatory Visit: Payer: BC Managed Care – PPO | Admitting: Internal Medicine

## 2023-04-15 ENCOUNTER — Encounter (INDEPENDENT_AMBULATORY_CARE_PROVIDER_SITE_OTHER): Payer: Self-pay

## 2023-05-15 ENCOUNTER — Ambulatory Visit: Payer: BC Managed Care – PPO | Admitting: Internal Medicine

## 2023-06-15 ENCOUNTER — Ambulatory Visit: Payer: BC Managed Care – PPO | Admitting: Internal Medicine

## 2023-07-17 ENCOUNTER — Ambulatory Visit: Payer: BC Managed Care – PPO | Admitting: Internal Medicine

## 2023-08-21 ENCOUNTER — Ambulatory Visit: Payer: BC Managed Care – PPO | Admitting: Internal Medicine

## 2023-09-03 ENCOUNTER — Ambulatory Visit: Payer: BC Managed Care – PPO | Admitting: Internal Medicine

## 2024-10-13 ENCOUNTER — Ambulatory Visit: Admitting: Internal Medicine

## 2024-10-13 ENCOUNTER — Other Ambulatory Visit (HOSPITAL_BASED_OUTPATIENT_CLINIC_OR_DEPARTMENT_OTHER): Payer: Self-pay

## 2024-10-13 VITALS — BP 170/112 | HR 86 | Temp 97.8°F | Ht 74.0 in | Wt 303.2 lb

## 2024-10-13 DIAGNOSIS — G4733 Obstructive sleep apnea (adult) (pediatric): Secondary | ICD-10-CM

## 2024-10-13 DIAGNOSIS — Z125 Encounter for screening for malignant neoplasm of prostate: Secondary | ICD-10-CM | POA: Diagnosis not present

## 2024-10-13 DIAGNOSIS — L989 Disorder of the skin and subcutaneous tissue, unspecified: Secondary | ICD-10-CM | POA: Diagnosis not present

## 2024-10-13 DIAGNOSIS — Z0001 Encounter for general adult medical examination with abnormal findings: Secondary | ICD-10-CM | POA: Diagnosis not present

## 2024-10-13 DIAGNOSIS — Z8619 Personal history of other infectious and parasitic diseases: Secondary | ICD-10-CM | POA: Diagnosis not present

## 2024-10-13 DIAGNOSIS — R5382 Chronic fatigue, unspecified: Secondary | ICD-10-CM | POA: Diagnosis not present

## 2024-10-13 DIAGNOSIS — Z136 Encounter for screening for cardiovascular disorders: Secondary | ICD-10-CM

## 2024-10-13 DIAGNOSIS — Z119 Encounter for screening for infectious and parasitic diseases, unspecified: Secondary | ICD-10-CM

## 2024-10-13 DIAGNOSIS — I10 Essential (primary) hypertension: Secondary | ICD-10-CM

## 2024-10-13 DIAGNOSIS — K089 Disorder of teeth and supporting structures, unspecified: Secondary | ICD-10-CM

## 2024-10-13 DIAGNOSIS — Z23 Encounter for immunization: Secondary | ICD-10-CM | POA: Diagnosis not present

## 2024-10-13 LAB — PSA: PSA: 0.55 ng/mL (ref 0.10–4.00)

## 2024-10-13 LAB — LIPID PANEL
Cholesterol: 168 mg/dL (ref 28–200)
HDL: 44.3 mg/dL
LDL Cholesterol: 106 mg/dL — ABNORMAL HIGH (ref 10–99)
NonHDL: 123.41
Total CHOL/HDL Ratio: 4
Triglycerides: 88 mg/dL (ref 10.0–149.0)
VLDL: 17.6 mg/dL (ref 0.0–40.0)

## 2024-10-13 LAB — CBC WITH DIFFERENTIAL/PLATELET
Basophils Absolute: 0.1 10*3/uL (ref 0.0–0.1)
Basophils Relative: 0.9 % (ref 0.0–3.0)
Eosinophils Absolute: 0.1 10*3/uL (ref 0.0–0.7)
Eosinophils Relative: 1.4 % (ref 0.0–5.0)
HCT: 46.7 % (ref 39.0–52.0)
Hemoglobin: 16 g/dL (ref 13.0–17.0)
Lymphocytes Relative: 17.5 % (ref 12.0–46.0)
Lymphs Abs: 1.1 10*3/uL (ref 0.7–4.0)
MCHC: 34.3 g/dL (ref 30.0–36.0)
MCV: 92.2 fl (ref 78.0–100.0)
Monocytes Absolute: 0.5 10*3/uL (ref 0.1–1.0)
Monocytes Relative: 8.8 % (ref 3.0–12.0)
Neutro Abs: 4.4 10*3/uL (ref 1.4–7.7)
Neutrophils Relative %: 71.4 % (ref 43.0–77.0)
Platelets: 167 10*3/uL (ref 150.0–400.0)
RBC: 5.07 Mil/uL (ref 4.22–5.81)
RDW: 13.3 % (ref 11.5–15.5)
WBC: 6.1 10*3/uL (ref 4.0–10.5)

## 2024-10-13 LAB — COMPREHENSIVE METABOLIC PANEL WITH GFR
ALT: 34 U/L (ref 3–53)
AST: 31 U/L (ref 5–37)
Albumin: 4.4 g/dL (ref 3.5–5.2)
Alkaline Phosphatase: 57 U/L (ref 39–117)
BUN: 12 mg/dL (ref 6–23)
CO2: 26 meq/L (ref 19–32)
Calcium: 9.3 mg/dL (ref 8.4–10.5)
Chloride: 102 meq/L (ref 96–112)
Creatinine, Ser: 0.84 mg/dL (ref 0.40–1.50)
GFR: 97.28 mL/min
Glucose, Bld: 104 mg/dL — ABNORMAL HIGH (ref 70–99)
Potassium: 4.4 meq/L (ref 3.5–5.1)
Sodium: 134 meq/L — ABNORMAL LOW (ref 135–145)
Total Bilirubin: 0.7 mg/dL (ref 0.2–1.2)
Total Protein: 7.4 g/dL (ref 6.0–8.3)

## 2024-10-13 MED ORDER — LOSARTAN POTASSIUM 100 MG PO TABS
100.0000 mg | ORAL_TABLET | Freq: Every day | ORAL | 3 refills | Status: AC
  Filled 2024-10-13: qty 90, 90d supply, fill #0

## 2024-10-13 NOTE — Assessment & Plan Note (Signed)
 Shared decision-making done; patient understood rationale and agreed to [facility]

## 2024-10-13 NOTE — Assessment & Plan Note (Signed)
 Hypertension is poorly controlled, likely due to untreated obstructive sleep apnea and inconsistent medication use from pharmacy issues. Blood pressure should be under 140/90 mmHg. CPAP therapy is crucial to manage blood pressure and reduce stroke risk. Start losartan  100 mg, beginning with 50 mg daily, adjusting based on home blood pressure readings. Monitor blood pressure at home and adjust losartan  dosage as needed. Referred to a sleep specialist for CPAP therapy.

## 2024-10-13 NOTE — Progress Notes (Signed)
 " [facility] [address] [place], [place] [address] Office:  [phone]  -- Annual Preventive Medical Office Visit --  Patient:  [name]      Age: 59 y.o.       Sex:  male  Date:   10/13/2024 Patient Care Team: Jesus Bernardino MATSU, MD as PCP - General (Internal Medicine) Buck Saucer, MD as Attending Physician (Neurology) Today's Healthcare Provider: Bernardino MATSU Jesus, MD  ========================================= Chief complaint: Annual Exam (Pt is present for cpe has not had food but coffee) and Hypertension (Would like to discuss bp medication changes he would like ER and once a day because he is not able to do twice a day pt has not taken bp this morning medication)  Purpose of Visit: Comprehensive preventive health assessment and personalized health maintenance planning.  This encounter was conducted as a Comprehensive Physical Exam (CPE) preventive care annual visit. The patient's medical history and problem list were reviewed to inform individualized preventive care recommendations.   No problem-specific medical treatment was provided during this visit.  Assessment & Plan Encounter for annual general medical examination with abnormal findings in adult Screening for malignant neoplasm of prostate Immunization due Screening examination for infectious disease Screening for heart disease Need for vaccination Comprehensive Preventive Examination (Annual Wellness Visit) Performed Today.  Abridge Summary:  Routine health maintenance is important for well-being. Discussed cardiovascular and cancer screenings. Emphasized vaccinations for pneumonia and shingles. Ordered a CT scan of the heart for cardiovascular screening. Administered pneumonia and shingles vaccines. Ordered blood work including cholesterol, liver, kidney function, and PSA test. Scheduled an annual physical exam in one year. ?? Core Review: Interval HPI/ROS reviewed. History, current  Medications (including reconciliation), known Allergies, and Immunization Status were thoroughly reviewed and updated. ?? Risk Assessment: Comprehensive Family, Social (including tobacco/alcohol/substance use), and Safety/Functional risks were assessed. Vitals and a complete, age-appropriate Physical Exam were fully documented. ? Preventive Planning: I reviewed all appropriate preventive screening tests (e.g., mammogram, colonoscopy, cervical cancer screening) and immunization needs (e.g., influenza, pneumococcal, Zoster). ??? Counseling & Education: Provided individualized counseling on Healthy Diet (e.g., DASH/Mediterranean), Regular Physical Activity (consistent with CDC guidelines), Sleep Hygiene, Stress Management strategies, and Mental Health screening results. ?? Conclusion: Shared decision-making was performed for all recommended interventions. Orders were placed for indicated screenings and/or vaccinations. Written educational materials were provided to reinforce today's counseling and plan. Hypertension, unspecified type Hypertension is poorly controlled, likely due to untreated obstructive sleep apnea and inconsistent medication use from pharmacy issues. Blood pressure should be under 140/90 mmHg. CPAP therapy is crucial to manage blood pressure and reduce stroke risk. Start losartan  100 mg, beginning with 50 mg daily, adjusting based on home blood pressure readings. Monitor blood pressure at home and adjust losartan  dosage as needed. Referred to a sleep specialist for CPAP therapy. Morbid obesity (HCC) Obesity contributes to hypertension and possibly sleep apnea. Weight loss is important for health improvement. Discussed CPAP therapy's potential benefits in reducing abdominal fat and stress-related weight gain. Encouraged lifestyle modifications for weight loss. Chronic fatigue Shared decision-making done; patient understood rationale and agreed to labwork  OSA (obstructive sleep  apnea) Untreated obstructive sleep apnea contributes to hypertension and health risks. CPAP therapy is essential to manage sleep apnea, improve blood pressure, and reduce stroke and dementia risk. It may also aid weight loss and overall health. Referred to a sleep specialist for CPAP therapy setup and titration. History of hepatitis C Previously treated for hepatitis C with  no detectable virus. Antibodies are present but not protective. No current need for hepatitis C testing unless symptoms arise. Ordered a hepatitis C antibody test to confirm the absence of active infection. Skin lesions Multiple skin lesions and rashes, including depigmentation and cracking, may be linked to dental issues. A dermatology evaluation is necessary to determine the cause and treatment. Referred to a dermatologist for evaluation and management. Dental disease Potential dental infections may contribute to skin issues and overall health problems. Regular dental care is necessary to prevent complications. Advised to seek dental care to address potential infections and prevent further complications.  Lab Results  Component Value Date   HGBA1C 5.4 09/25/2022   HGBA1C 5.3 03/11/2017      ICD-10-CM   1. Encounter for annual general medical examination with abnormal findings in adult  Z00.01     2. Hypertension, unspecified type  I10     3. Morbid obesity (HCC)  E66.01     4. Chronic fatigue  R53.82     5. Screening for heart disease  Z13.6     6. OSA (obstructive sleep apnea)  G47.33      Reviewed/updated/encouraged completion: Immunization History  Administered Date(s) Administered   Hep A / Hep B 05/07/2017, 06/08/2017, 12/07/2017   Influenza-Unspecified 05/20/2017   Tdap 10/18/2016   Health Maintenance Due  Topic Date Due   HIV Screening  Never done   Zoster Vaccines- Shingrix (1 of 2) Never done   Pneumococcal Vaccine: 50+ Years (1 of 1 - PCV) Never done  Agreed to shingles and prevnar   Health  Maintenance  Topic Date Due   HIV Screening  Never done   Zoster Vaccines- Shingrix (1 of 2) Never done   Pneumococcal Vaccine: 50+ Years (1 of 1 - PCV) Never done   DTaP/Tdap/Td (2 - Td or Tdap) 10/18/2026   Colonoscopy  10/15/2032   Hepatitis B Vaccines 19-59 Average Risk  Completed   HPV VACCINES (No Doses Required) Completed   Hepatitis C Screening  Completed   Meningococcal B Vaccine  Aged Out   Influenza Vaccine  Discontinued   COVID-19 Vaccine  Discontinued    Reviewed the following verbally with patient and provided AVS materials:   HEALTH MAINTENANCE COUNSELING AND ANTICIPATORY GUIDANCE    Preventive Measure Recommendation  Eye Exams Every 1-2 years  Dental Care Cleanings every 6 months or more, brush/floss 3x daily  Sleep 8 hours nightly, good sleep hygiene, e-monitoring if any daytime drowsiness referral made to sleep team due to untreated obstructive sleep apnea.  Diet Fruits/vegetables/fiber/healthy fats, balance and moderation  Exercise 150 minutes weekly  Risk Behaviors Discouraged any/all high risk behaviors    CANCER SCREENING SHARED DECISION MAKING    Penile/Testicle/Scrotum Encouraged self-monitoring and reporting of genital abnormalities. Patient reports none.  Thyroid Thyroid was palpated for nodules today.  Prostate Individualized risks/benefits/costs discussed  Patient 55-69, average or informed higher risk. Shared decision-making completed; patient chooses PSA-based screening. Order placed; recall 1-2 yrs (extend to 2-4 yrs if PSA very low). No results found for: PSA  Colon  Had ColoGuard 2024  Lung Current guidelines recommend individuals aged 45 to 38 who currently smoke or formerly smoked and have a >= 20 pack-year smoking history should undergo annual screening with low-dose computed tomography (LDCT). Tobacco Use: Medium Risk (10/13/2024)   Patient History    Smoking Tobacco Use: Former    Smokeless Tobacco Use: Never    Passive Exposure: Not on  file  Tobacco Use History[1]  Skin Advised regular sunscreen use. Patient denies worrisome, changing, or new skin lesions. Offered to include images in chart for surveillance. Showed patient these pictures of melanomas for reference to educate for self-monitoring.  Other Cancers Discussed lack of screening guidelines and insurance coverage for other cancer types.   Photographs Taken 10/13/2024 :      Discussed the use of AI scribe software for clinical note transcription with the patient, who gave verbal consent to proceed.  History of Present Illness 59 year old male who presents for an annual physical exam.  He has a history of hypertension and has been taking losartan , previously at a dose of 25 mg twice daily. He has missed doses and is currently out of medication due to a change in pharmacies. He has not taken his medication today and notes that his blood pressure was high during the visit.  He has a history of sleep apnea and acknowledges that addressing this condition could help manage his blood pressure. He has previously undergone a home sleep test but did not follow through with further steps. He expresses a need to revisit this issue.  He has a history of hepatitis C, for which he underwent treatment in 1999. The virus is no longer detectable, although antibodies remain. He has not had a test in a couple of years and is open to having one.  He describes a tendency to pick at his skin, resulting in bumps and scratches. He mentions a red rash that appears in the winter and depigmentation on his arms. He has not seen a dermatologist for these issues.  He acknowledges dental issues and has not been seeing a dentist regularly due to lack of insurance coverage.  He discusses his social history, mentioning that he has a new granddaughter and desires to live a long and healthy life to see her grow up. He also notes that his father, at 34, remains active, which he aspires to emulate. He  reports a history of chickenpox in childhood and notes that his daughter had chickenpox twice. He has not received the pneumonia or shingles vaccines.    ROS A comprehensive ROS was negative for any concerning symptoms not already mentioned in HPI.  Completed medication reconciliation: Current Outpatient Medications on File Prior to Visit  Medication Sig   buPROPion  (WELLBUTRIN  XL) 150 MG 24 hr tablet Take 1 tablet (150 mg total) by mouth daily.   fluticasone (FLONASE  SENSIMIST) 27.5 MCG/SPRAY nasal spray Place 2 sprays into the nose daily.   Ibuprofen (ADVIL PO) Take 1 tablet by mouth as needed.   olopatadine  (PATANOL) 0.1 % ophthalmic solution Place 1 drop into both eyes 2 (two) times daily.   Saline (SIMPLY SALINE) 0.9 % AERS Place 2 each into the nose daily as needed.   Topiramate  ER (TROKENDI  XR) 100 MG CP24 Take 1 capsule (100 mg total) by mouth daily at 6 (six) AM.   UNABLE TO FIND Take 1 tablet by mouth daily. Alieve   No current facility-administered medications on file prior to visit.   Medications Discontinued During This Encounter  Medication Reason   losartan  (COZAAR ) 50 MG tablet    losartan  (COZAAR ) 25 MG tablet   The following were reviewed and/or entered/updated into our electronic MEDICAL RECORD NUMBER Past Medical History:  Diagnosis Date   Allergy    Arthritis    Lumbar Spine. Diagnosed around 15 years ago per patient.    Environmental allergies    GERD (gastroesophageal reflux disease)  History of chickenpox    History of hepatitis C 1999   Treatment in 2000-2001. Ribovarin and Interferon injections. Late 2001 - Test of Cure   Hypertension    Umbilical hernia 09/25/2022   Chronic without gangrene R periumbilical large   Past Surgical History:  Procedure Laterality Date   I & D EXTREMITY Right 10/18/2016   Procedure: IRRIGATION AND DEBRIDEMENT OPEN FRACTURE PINNING OF THUMB, REPAIR OF NAILBED AND KNUCKLE ABRASIONS;  Surgeon: Franky Curia, MD;  Location: [facility] OR;   Service: Orthopedics;  Laterality: Right;   VASECTOMY     Social History   Socioeconomic History   Marital status: Married    Spouse name: Not on file   Number of children: Not on file   Years of education: Not on file   Highest education level: 12th grade  Occupational History   Not on file  Tobacco Use   Smoking status: Former    Types: Cigars    Quit date: 09/15/2012    Years since quitting: 12.0   Smokeless tobacco: Never  Vaping Use   Vaping status: Never Used  Substance and Sexual Activity   Alcohol use: Yes    Alcohol/week: 3.0 standard drinks of alcohol    Types: 3 Cans of beer per week    Comment: 10-12 Beers per week   Drug use: Never    Types: Marijuana   Sexual activity: Not Currently    Birth control/protection: Surgical, None    Comment: vasectomy  Other Topics Concern   Not on file  Social History Narrative   Not on file   Social Drivers of Health   Tobacco Use: Medium Risk (10/13/2024)   Patient History    Smoking Tobacco Use: Former    Smokeless Tobacco Use: Never    Passive Exposure: Not on file  Financial Resource Strain: Low Risk (10/13/2024)   Overall Financial Resource Strain (CARDIA)    Difficulty of Paying Living Expenses: Not very hard  Food Insecurity: No Food Insecurity (10/13/2024)   Epic    Worried About Radiation Protection Practitioner of Food in the Last Year: Never true    Ran Out of Food in the Last Year: Never true  Transportation Needs: No Transportation Needs (10/13/2024)   Epic    Lack of Transportation (Medical): No    Lack of Transportation (Non-Medical): No  Physical Activity: Inactive (10/13/2024)   Exercise Vital Sign    Days of Exercise per Week: 0 days    Minutes of Exercise per Session: Not on file  Stress: No Stress Concern Present (10/13/2024)   [employer] of Occupational Health - Occupational Stress Questionnaire    Feeling of Stress: Only a little  Social Connections: Moderately Isolated (10/13/2024)   Social Connection and  Isolation Panel    Frequency of Communication with Friends and Family: More than three times a week    Frequency of Social Gatherings with Friends and Family: Twice a week    Attends Religious Services: Never    Database Administrator or Organizations: No    Attends Engineer, Structural: Not on file    Marital Status: Married  Catering Manager Violence: Not on file  Depression (PHQ2-9): Low Risk (10/13/2024)   Depression (PHQ2-9)    PHQ-2 Score: 0  Alcohol Screen: Low Risk (10/13/2024)   Alcohol Screen    Last Alcohol Screening Score (AUDIT): 6  Housing: Low Risk (10/13/2024)   Epic    Unable to Pay for Housing in the Last Year: No  Number of Times Moved in the Last Year: 0    Homeless in the Last Year: No  Utilities: Not on file  Health Literacy: Not on file      10/13/2024    5:29 AM  Alcohol Use Disorder Test (AUDIT)  1. How often do you have a drink containing alcohol? 3   2. How many drinks containing alcohol do you have on a typical day when you are drinking? 1   3. How often do you have six or more drinks on one occasion? 1   AUDIT-C Score 5   4. How often during the last year have you found that you were not able to stop drinking once you had started? 0   5. How often during the last year have you failed to do what was normally expected from you because of drinking? 0   6. How often during the last year have you needed a first drink in the morning to get yourself going after a heavy drinking session? 0   7. How often during the last year have you had a feeling of guilt of remorse after drinking? 1   8. How often during the last year have you been unable to remember what happened the night before because you had been drinking? 0   9. Have you or someone else been injured as a result of your drinking? 0   10. Has a relative or friend or a doctor or another health worker been concerned about your drinking or suggested you cut down? 0   Alcohol Use Disorder  Identification Test Final Score (AUDIT) 6      Manually entered by patient   Family History  Problem Relation Age of Onset   Hypertension Mother    Arthritis Mother    Cancer Mother        Breast   Stroke Mother    Hypertension Father    Arthritis Father    Healthy Father    Sleep apnea Father    Healthy Sister    Hypertension Maternal Uncle    Dementia Maternal Uncle    Sleep apnea Paternal Uncle    Arthritis Maternal Grandmother    Alzheimer's disease Maternal Grandmother    Heart disease Maternal Grandfather    Cancer Maternal Grandfather    Arthritis Maternal Grandfather    Hypertension Maternal Grandfather    Alzheimer's disease Maternal Grandfather    Heart attack Maternal Grandfather    Arthritis Paternal Grandmother    Hypertension Paternal Grandfather    Arthritis Paternal Grandfather   Allergies[2] Social History   Substance and Sexual Activity  Sexual Activity Not Currently   Birth control/protection: Surgical, None   Comment: vasectomy  @    10/13/2024    8:41 AM  Depression screen PHQ 2/9  Decreased Interest 0  Down, Depressed, Hopeless 0  PHQ - 2 Score 0      10/13/2024    8:41 AM  Fall Risk   Falls in the past year? 0  Number falls in past yr: 0  Injury with Fall? 0  Risk for fall due to : No Fall Risks  Follow up Falls evaluation completed     BP (!) 170/112   Pulse 86   Temp 97.8 F (36.6 C) (Temporal)   Ht 6' 2 (1.88 m)   Wt (!) 303 lb 3.2 oz (137.5 kg)   SpO2 98%   BMI 38.93 kg/m  BP Readings from Last 3 Encounters:  10/13/24 ROLLEN)  170/112  11/12/22 126/74  10/14/22 (!) 172/103   Wt Readings from Last 10 Encounters:  10/13/24 (!) 303 lb 3.2 oz (137.5 kg)  11/12/22 (!) 302 lb 12.8 oz (137.3 kg)  10/14/22 (!) 308 lb (139.7 kg)  10/09/22 (!) 312 lb 3.2 oz (141.6 kg)  09/25/22 (!) 310 lb (140.6 kg)  03/11/17 (!) 301 lb (136.5 kg)  10/18/16 300 lb (136.1 kg)  Physical Exam  Physical Exam HEENT: Ears normal, oral cavity  normal SKIN: Red rash on neck, depigmentation on back and arms, skin cracks open, swelling and thickness in neck Photographs Taken 10/13/2024 :      GEN: No acute distress, resting comfortably. HEENT: Tympanic membranes normal appearing bilaterally, oropharynx clear, poor dentition, supraclavicular swellingno thyromegaly noted, no palpable lymphadenopathy or thyroid nodules. CARDIOVASCULAR: S1 and S2 heart sounds with regular rate and rhythm, no murmurs appreciated. PULMONARY: Normal work of breathing, clear to auscultation bilaterally, no crackles, wheezes, or rhonchi. ABDOMEN: Soft, nontender, nondistended. Severe truncal adiposity  MSK: No edema, cyanosis, or clubbing noted. SKIN: Warm, dry, cracking and rashes and skin lesions numerous- referral made dermatology  NEUROLOGICAL: Cranial nerves II-XII grossly intact, strength 5/5 in upper and lower extremities, reflexes symmetric and intact bilaterally. PSYCH: Normal affect and thought content, pleasant and cooperative.  Last CBC Lab Results  Component Value Date   WBC 9.0 09/25/2022   HGB 16.5 09/25/2022   HCT 48.1 09/25/2022   MCV 94.1 09/25/2022   RDW 12.9 09/25/2022   PLT 176.0 09/25/2022   Last metabolic panel Lab Results  Component Value Date   GLUCOSE 80 10/09/2022   NA 138 10/09/2022   K 4.3 10/09/2022   CL 106 10/09/2022   CO2 23 10/09/2022   BUN 14 10/09/2022   CREATININE 1.06 10/09/2022   GFR 79.40 10/09/2022   CALCIUM 9.2 10/09/2022   PROT 7.5 09/25/2022   ALBUMIN 4.6 09/25/2022   BILITOT 0.6 09/25/2022   ALKPHOS 53 09/25/2022   AST 29 09/25/2022   ALT 30 09/25/2022   Last lipids Lab Results  Component Value Date   CHOL 171 09/25/2022   HDL 36.00 (L) 09/25/2022   LDLCALC 109 (H) 09/25/2022   TRIG 131.0 09/25/2022   CHOLHDL 5 09/25/2022   Last hemoglobin A1c Lab Results  Component Value Date   HGBA1C 5.4 09/25/2022   Last thyroid functions Lab Results  Component Value Date   TSH 2.92  09/25/2022   Last vitamin D  No results found for: 25OHVITD2, 25OHVITD3, VD25OH Last vitamin B12 and Folate Lab Results  Component Value Date   VITAMINB12 330 03/11/2017        ======================================  IMPORTANT HEALTH REMINDERS: Report any new or changing skin lesions promptly Maintain recommended screening schedules Discuss any new family history of cancer at future visits Follow up on any new symptoms that persist more than two weeks      Notes:  This document was synthesized by artificial intelligence (Abridge) using HIPAA-compliant recording of the clinical interaction;   We discussed the use of AI scribe software for clinical note transcription with the patient, who gave verbal consent to proceed.    This encounter employed state-of-the-art, real-time, collaborative documentation. The patient was empowered to actively review and assist in updating their electronic medical record on a shared monitor, ensuring transparency and improving accuracy.    Prior to and at the beginning of Comprehensive Physical Exam (CPE) preventive care annual visit appointment types  we clarify to patients Our goal today is to focus on  your preventive or annual Comprehensive Physical Exam (CPE) preventive care annual visit, which typically covers routine screenings and overall health maintenance. However, if you share any new or concerning symptoms--such as dizziness, passing out, severe pain, or anything else that may point to a more serious issue--we are both legally and ethically required to evaluate it. We cannot simply overlook or ignore such concerns, even if you later decide you dont want to discuss them, because it could jeopardize your health.  If addressing a new concern takes us  beyond the scope of the preventive visit, we may need to bill separately for that portion of care. We understand financial considerations are important, and were happy to discuss your options if  something new comes up. However, we want to be clear that once you mention a potentially serious issue, we must investigate it; we cant ethically or legally exclude that from our records or our evaluation. Please let us  know all of your questions or worries. Together, we can decide how best to manage them and how to minimize any unexpected costs, but we want to keep you safe above all else.   This disclosure is mandated by professional ethics and legal obligations, as healthcare providers must address any substantial health concerns raised during any patient interaction and a comprehensive ROS is required by insurance companies for billing preventive-care visit type.   This disclosure ultimately discourages patients financially from reporting significant health issues.   Medical Screening Exam A medical screening exam (MSE) helps to determine whether you need immediate medical treatment relating to any number of symptoms you are having. This type of exam may be done in an emergency department, an urgent care setting, or your health care provider's office. Depending on your symptoms and severity, you may need additional tests or medical therapy. It is important to note that an MSE does not necessarily mean that you will need or receive further medical testing or interventions if your symptoms are not deemed to be medically urgent (emergent). Tell a health care provider about: Any allergies you have. All medicines you are taking, including vitamins, herbs, eye drops, creams, and over-the-counter medicines. Any problems you or family members have had with anesthetic medicines. Any bleeding problems you have. Any surgeries you have had. Any medical conditions you have. Whether you are pregnant or may be pregnant. What happens during the test? During the exam, a health care provider does a short, often focused, physical exam and asks about your medical history to assess: Your current symptoms. Your  overall health. Your need for possible further medical intervention. What can I expect after the test? If you have a regular health care provider, make an appointment for a follow-up visit with him or her. If you do not have a regular health care provider, ask about resources in your community. Your medical screening exam may determine that: You do not need emergency treatment at this time. You need treatment right away. You need to be transferred to another medical center. This may happen if you need an emergent specialist or consultant that is not available at the medical center you are at. You need to have more tests. A medical specialist may be consulted if needed. Get help right away if: Your condition gets worse. You develop new or troubling symptoms before you see your health care provider. These symptoms may represent a serious problem that is an emergency. Do not wait to see if the symptoms will go away. Get medical help right away. Call  your local emergency services (911 in the [place] Do not drive yourself to the hospital. Summary A medical screening exam helps to determine whether you need medical treatment right away. This type of exam may be done in an emergency department, an urgent care setting, or your health care provider's office. During the exam, a health care provider does a short physical exam and asks about your current symptoms and overall health. Depending on the exam, more tests or therapies may be ordered. However, an MSE does not necessarily mean that you will have further medical testing if your symptoms are not deemed to be urgent. If you need further care that is not offered at your current medical center, you may need to be transferred to another facility. This information is not intended to replace advice given to you by your health care provider. Make sure you discuss any questions you have with your health care provider. Document Revised: 05/15/2021 Document  Reviewed: 01/10/2021 Elsevier Patient Education  2024 [employer]   Health Maintenance, Male Adopting a healthy lifestyle and getting preventive care are important in promoting health and wellness. Ask your health care provider about: The right schedule for you to have regular tests and exams. Things you can do on your own to prevent diseases and keep yourself healthy. What should I know about diet, weight, and exercise? Eat a healthy diet  Eat a diet that includes plenty of vegetables, fruits, low-fat dairy products, and lean protein. Do not eat a lot of foods that are high in solid fats, added sugars, or sodium. Maintain a healthy weight Body mass index (BMI) is a measurement that can be used to identify possible weight problems. It estimates body fat based on height and weight. Your health care provider can help determine your BMI and help you achieve or maintain a healthy weight. Get regular exercise Get regular exercise. This is one of the most important things you can do for your health. Most adults should: Exercise for at least 150 minutes each week. The exercise should increase your heart rate and make you sweat (moderate-intensity exercise). Do strengthening exercises at least twice a week. This is in addition to the moderate-intensity exercise. Spend less time sitting. Even light physical activity can be beneficial. Watch cholesterol and blood lipids Have your blood tested for lipids and cholesterol at 57 years of age, then have this test every 5 years. You may need to have your cholesterol levels checked more often if: Your lipid or cholesterol levels are high. You are older than 57 years of age. You are at high risk for heart disease. What should I know about cancer screening? Many types of cancers can be detected early and may often be prevented. Depending on your health history and family history, you may need to have cancer screening at various ages. This may include  screening for: Colorectal cancer. Prostate cancer. Skin cancer. Lung cancer. What should I know about heart disease, diabetes, and high blood pressure? Blood pressure and heart disease High blood pressure causes heart disease and increases the risk of stroke. This is more likely to develop in people who have high blood pressure readings or are overweight. Talk with your health care provider about your target blood pressure readings. Have your blood pressure checked: Every 3-5 years if you are 24-15 years of age. Every year if you are 37 years old or older. If you are between the ages of 106 and 70 and are a current or former smoker, ask  your health care provider if you should have a one-time screening for abdominal aortic aneurysm (AAA). Diabetes Have regular diabetes screenings. This checks your fasting blood sugar level. Have the screening done: Once every three years after age 82 if you are at a normal weight and have a low risk for diabetes. More often and at a younger age if you are overweight or have a high risk for diabetes. What should I know about preventing infection? Hepatitis B If you have a higher risk for hepatitis B, you should be screened for this virus. Talk with your health care provider to find out if you are at risk for hepatitis B infection. Hepatitis C Blood testing is recommended for: Everyone born from [birth year] through 1963. Anyone with known risk factors for hepatitis C. Sexually transmitted infections (STIs) You should be screened each year for STIs, including gonorrhea and chlamydia, if: You are sexually active and are younger than 57 years of age. You are older than 57 years of age and your health care provider tells you that you are at risk for this type of infection. Your sexual activity has changed since you were last screened, and you are at increased risk for chlamydia or gonorrhea. Ask your health care provider if you are at risk. Ask your health care  provider about whether you are at high risk for HIV. Your health care provider may recommend a prescription medicine to help prevent HIV infection. If you choose to take medicine to prevent HIV, you should first get tested for HIV. You should then be tested every 3 months for as long as you are taking the medicine. Follow these instructions at home: Alcohol use Do not drink alcohol if your health care provider tells you not to drink. If you drink alcohol: Limit how much you have to 0-2 drinks a day. Know how much alcohol is in your drink. In the [place] one drink equals one 12 oz bottle of beer (355 mL), one 5 oz glass of wine (148 mL), or one 1 oz glass of hard liquor (44 mL). Lifestyle Do not use any products that contain nicotine or tobacco. These products include cigarettes, chewing tobacco, and vaping devices, such as e-cigarettes. If you need help quitting, ask your health care provider. Do not use street drugs. Do not share needles. Ask your health care provider for help if you need support or information about quitting drugs. General instructions Schedule regular health, dental, and eye exams. Stay current with your vaccines. Tell your health care provider if: You often feel depressed. You have ever been abused or do not feel safe at home. Summary Adopting a healthy lifestyle and getting preventive care are important in promoting health and wellness. Follow your health care provider's instructions about healthy diet, exercising, and getting tested or screened for diseases. Follow your health care provider's instructions on monitoring your cholesterol and blood pressure. This information is not intended to replace advice given to you by your health care provider. Make sure you discuss any questions you have with your health care provider. Document Revised: 01/21/2021 Document Reviewed: 01/21/2021 Elsevier Patient Education  2024 [employer]    [1]  Social History Tobacco Use   Smoking Status Former   Types: Cigars   Quit date: 09/15/2012   Years since quitting: 12.0  Smokeless Tobacco Never  [2]  Allergies Allergen Reactions   Pollen Extract Shortness Of Breath and Swelling    Grass    "

## 2024-10-13 NOTE — Assessment & Plan Note (Signed)
 Obesity contributes to hypertension and possibly sleep apnea. Weight loss is important for health improvement. Discussed CPAP therapy's potential benefits in reducing abdominal fat and stress-related weight gain. Encouraged lifestyle modifications for weight loss.

## 2024-10-13 NOTE — Assessment & Plan Note (Signed)
 Untreated obstructive sleep apnea contributes to hypertension and health risks. CPAP therapy is essential to manage sleep apnea, improve blood pressure, and reduce stroke and dementia risk. It may also aid weight loss and overall health. Referred to a sleep specialist for CPAP therapy setup and titration.

## 2024-10-13 NOTE — Patient Instructions (Addendum)
 VISIT SUMMARY: During your annual physical exam, we discussed several health concerns including your hypertension, sleep apnea, obesity, skin lesions, dental health, and history of hepatitis C. We also reviewed general health maintenance and vaccinations.  YOUR PLAN: -ESSENTIAL HYPERTENSION: Hypertension, or high blood pressure, is not well controlled, likely due to untreated sleep apnea and inconsistent medication use. Your blood pressure should be under 140/90 mmHg. We will start you on losartan  100 mg, beginning with 50 mg daily, and adjust based on your home blood pressure readings. Please monitor your blood pressure at home and adjust the dosage as needed. You are also referred to a sleep specialist for CPAP therapy to help manage your blood pressure and reduce stroke risk.  -OBSTRUCTIVE SLEEP APNEA: Obstructive sleep apnea is a condition where your breathing stops and starts during sleep, contributing to hypertension and other health risks. CPAP therapy is essential to manage sleep apnea, improve blood pressure, and reduce the risk of stroke and dementia. It may also aid in weight loss and overall health. You are referred to a sleep specialist for CPAP therapy setup and titration.  -MORBID OBESITY: Obesity can contribute to hypertension and sleep apnea. Weight loss is important for improving your health. We discussed the potential benefits of CPAP therapy in reducing abdominal fat and stress-related weight gain. You are encouraged to make lifestyle modifications for weight loss.  -CHRONIC SKIN LESIONS AND RASHES: You have multiple skin lesions and rashes, including depigmentation and cracking, which may be linked to dental issues. A dermatology evaluation is necessary to determine the cause and treatment. You are referred to a dermatologist for evaluation and management.  -DENTAL DISEASE: Potential dental infections may contribute to skin issues and overall health problems. Regular dental care is  necessary to prevent complications. You are advised to seek dental care to address potential infections and prevent further complications.  -HISTORY OF HEPATITIS C: You were previously treated for hepatitis C, and the virus is no longer detectable, although antibodies remain. There is no current need for hepatitis C testing unless symptoms arise. We have ordered a hepatitis C antibody test to confirm the absence of active infection.  -GENERAL HEALTH MAINTENANCE: Routine health maintenance is important for your well-being. We discussed cardiovascular and cancer screenings and emphasized the importance of vaccinations for pneumonia and shingles. We have ordered a CT scan of the heart for cardiovascular screening, administered pneumonia and shingles vaccines, and ordered blood work including cholesterol, liver, kidney function, and PSA test. Your next annual physical exam is scheduled in one year.  INSTRUCTIONS: Please follow up with the sleep specialist for CPAP therapy, the dermatologist for your skin lesions, and the dentist for  your dental health. Monitor your blood pressure at home and adjust your losartan  dosage as needed. Complete the ordered blood work and attend your scheduled CT scan of the heart. Your next annual physical exam is scheduled in one year.  Building Your Long-Term Health Plan  During today's preventive visit, we covered a variety of important health checks to help you stay on top of your well-being.  We also discussed strategies to maintain your health and identified some areas that might benefit from further exploration.   Preventive care visits like today's are designed to be proactive, but sometimes additional attention may be needed.  Rest assured, we're here for you.  If these areas require further evaluation or management, we'd be happy to schedule a separate, focused appointment to address them in detail.  Addressing Next Steps  [x]   Follow-up Visit: To ensure we  address any unresolved issues and continue monitoring your overall health, we recommend scheduling a follow-up appointment in 1 year for your next preventive care visit. If you experience any new problems, need to discuss any medical concerns, or your condition worsens before then, please don't hesitate to call our office to schedule an appointment or seek emergency care as needed.  [x]   Preventive Measures: Maintaining healthy habits plays a crucial role in overall wellness. We recommend considering these tips: [x]   Regular appointments with dental and vision professionals [x]   Nightly nasal saline mist to keep sinuses clear [x]   Consistent toothbrushing to maintain oral health [x]   Using an app like SnoreLab to track sleep quality [x]   Routine checks of blood pressure and heart rate [x]   Medical Information: In some instances, we may require additional medical information from other providers to create a comprehensive picture of your health. If applicable, we can provide a medical information release form at the front desk for you to sign, allowing us  to gather these records. [x]   Lab Tests: If any lab tests were ordered today, scheduling them within a week of your visit helps ensure the best possible insurance coverage.  Planning Follow Up to Work on a Problem? Make the Most of Our Focused (20 minute) Appointments  [x]   Clearly state your top concerns at the beginning of the visit to focus our discussion [x]   If you anticipate you will need more time, please inform the front desk during scheduling - we can book multiple appointments in the same week. [x]   If you have transportation problems- use our convenient video appointments or ask about transportation support. [x]   We can get down to business faster if you use MyChart to update information before the visit and submit non-urgent questions before your visit. Thank you for taking the time to provide details through MyChart.  Let our nurse know  and she can import this information into your encounter documents.  Arrival and Wait Times  [x]   Arriving on time ensures that everyone receives prompt attention. [x]   Early morning (8a) and afternoon (1p) appointments tend to have shortest wait times. [x]   Unfortunately, we cannot delay appointments for late arrivals or hold slots during phone calls.  Bring to Your Next Appointment:  [x]   Medications: Please bring all your medication bottles to your next appointment to ensure we have an accurate record of your prescriptions. [x]   Health Diaries: If you're monitoring any health conditions at home, keeping a diary of your readings can be very helpful for discussions at your next appointment.  Reviewing Your Records  [x]   Review your attached preventive care information at the end of these patient instructions. [x]   Review this early draft of your clinical encounter notes below and the final encounter summary tomorrow on MyChart after its been completed.      Getting Answers and Following Up  [x]   Simple Questions & Concerns: For quick questions or basic follow-up after your visit, reach us  at [phone] or MyChart messaging. [x]   Complex Concerns: If your concern is more complex, scheduling an appointment might be best. Discuss this with the staff to find the most suitable option. [x]   Lab & Imaging Results: We'll contact you directly if results are abnormal or you don't use MyChart. Most normal results will be on MyChart within 2-3 business days, with a review message from Dr. Jesus. Haven't heard back in 2 weeks? Need results sooner? Contact us   at [phone]. [x]   Referrals: Our referral coordinator will manage specialist referrals. The specialist's office should contact you within 2 weeks to schedule an appointment. Call us  if you haven't heard from them after 2 weeks.  Staying Connected  [x]   MyChart: Activate your MyChart for the fastest way to access results and message  us . See the last page of this paperwork for instructions on how to activate.  Billing  [x]   X-ray & Lab Orders: These are billed by separate companies. Contact the invoicing company directly for questions or concerns. [x]   Visit Charges: Discuss any billing inquiries with our administrative services team.  Your Satisfaction Matters  [x]   Share Your Experience: We strive for your satisfaction! If you have any complaints, or preferably compliments, please let Dr. Jesus know directly or contact our Practice Administrators, Manuelita Rubin or [employer], by asking at the front desk.                 Next Steps  [x]   Schedule Follow-Up:  We recommend a follow-up appointment in 1 year for your next wellness visit.  If you develop any new problems, want to address any medical issues, or your condition worsens before then, please call us  for an appointment or seek emergency care. [x]   Preventive Care:  Make sure to keep regular appointments with dental and vision professionals, use nightly nasal saline mist sprays to keep your sinuses clear and toothbrushing to protect your teeth. Use SnoreLab App or other app to track your sleep quality. Check blood pressure and heart rate routinely. [x]   Medical Information Release:  For any relevant medical information we don't have, please sign a release form at the front desk so we can obtain it for your records. [x]   Lab Tests:  Schedule any lab tests from today for within a week to ensure best insurance coverage.    Making the Most of Our Focused (20 minute) Appointments:  [x]   Clearly state your top concerns at the beginning of the visit to focus our discussion [x]   If you anticipate you will need more time, please inform the front desk during scheduling - we can book multiple appointments in the same week. [x]   If you have transportation problems- use our convenient video appointments or ask about transportation support. [x]   We can get down  to business faster if you use MyChart to update information before the visit and submit non-urgent questions before your visit. Thank you for taking the time to provide details through MyChart.  Let our nurse know and she can import this information into your encounter documents.  Arrival and Wait Times: [x]   Arriving on time ensures that everyone receives prompt attention. [x]   Early morning (8a) and afternoon (1p) appointments tend to have shortest wait times. [x]   Unfortunately, we cannot delay appointments for late arrivals or hold slots during phone calls.  Bring to Your Next Appointment  [x]   Medications: Please bring all your medication bottles to your next appointment to ensure we have an accurate record of your prescriptions. [x]   Health Diaries: If you're monitoring any health conditions at home, keeping a diary of your readings can be very helpful for discussions at your next appointment.  Reviewing Your Records  [x]   Review your attached preventive care information at the end of these patient instructions. [x]   Review this early draft of your clinical encounter notes below and the final encounter summary tomorrow on MyChart after its been completed.   Encounter  for annual general medical examination with abnormal findings in adult -     Losartan  Potassium; Take 1 tablet (100 mg total) by mouth daily. To start, just 0.5 tablets daily for at least 3 days  Dispense: 90 tablet; Refill: 3 -     CT CARDIAC SCORING (SELF PAY ONLY); Future -     Lipid panel -     Comprehensive metabolic panel with GFR -     CBC with Differential/Platelet -     TSH Rfx on Abnormal to Free T4 -     PSA  Hypertension, unspecified type -     Losartan  Potassium; Take 1 tablet (100 mg total) by mouth daily. To start, just 0.5 tablets daily for at least 3 days  Dispense: 90 tablet; Refill: 3  Morbid obesity (HCC) -     CT CARDIAC SCORING (SELF PAY ONLY); Future -     Lipid panel -     Comprehensive  metabolic panel with GFR -     CBC with Differential/Platelet -     TSH Rfx on Abnormal to Free T4  Chronic fatigue -     CT CARDIAC SCORING (SELF PAY ONLY); Future -     Lipid panel -     Comprehensive metabolic panel with GFR -     CBC with Differential/Platelet -     TSH Rfx on Abnormal to Free T4  Screening for heart disease -     CT CARDIAC SCORING (SELF PAY ONLY); Future  OSA (obstructive sleep apnea) -     Ambulatory referral to Sleep Studies  Need for vaccination  Screening for malignant neoplasm of prostate -     PSA  History of hepatitis C -     HIV Antibody (routine testing w rflx) -     HCV RNA quant rflx ultra or genotyp  Screening examination for infectious disease -     HIV Antibody (routine testing w rflx)  Skin lesions -     Ambulatory referral to Dermatology  Immunization due -     Varicella-zoster vaccine IM -     Pneumococcal conjugate vaccine 20-valent  Dental disease     Getting Answers and Following Up  [x]   Simple Questions & Concerns: For quick questions or basic follow-up after your visit, reach us  at [phone] or MyChart messaging. [x]   Complex Concerns: If your concern is more complex, scheduling an appointment might be best. Discuss this with the staff to find the most suitable option. [x]   Lab & Imaging Results: We'll contact you directly if results are abnormal or you don't use MyChart. Most normal results will be on MyChart within 2-3 business days, with a review message from Dr. Jesus. Haven't heard back in 2 weeks? Need results sooner? Contact us  at [phone]. [x]   Referrals: Our referral coordinator will manage specialist referrals. The specialist's office should contact you within 2 weeks to schedule an appointment. Call us  if you haven't heard from them after 2 weeks.  Staying Connected  [x]   MyChart: Activate your MyChart for the fastest way to access results and message us . See the last page of this paperwork for  instructions on how to activate.  Billing  [x]   X-ray & Lab Orders: These are billed by separate companies. Contact the invoicing company directly for questions or concerns. [x]   Visit Charges: Discuss any billing inquiries with our administrative services team.  Your Satisfaction Matters  [x]   Share Your Experience: We strive for your  satisfaction! If you have any complaints, or preferably compliments, please let Dr. Jesus know directly or contact our Practice Administrators, Manuelita Rubin or [employer], by asking at the front desk.    Medical Screening Exam A medical screening exam (MSE) helps to determine whether you need immediate medical treatment relating to any number of symptoms you are having. This type of exam may be done in an emergency department, an urgent care setting, or your health care provider's office. Depending on your symptoms and severity, you may need additional tests or medical therapy. It is important to note that an MSE does not necessarily mean that you will need or receive further medical testing or interventions if your symptoms are not deemed to be medically urgent (emergent). Tell a health care provider about: Any allergies you have. All medicines you are taking, including vitamins, herbs, eye drops, creams, and over-the-counter medicines. Any problems you or family members have had with anesthetic medicines. Any bleeding problems you have. Any surgeries you have had. Any medical conditions you have. Whether you are pregnant or may be pregnant. What happens during the test? During the exam, a health care provider does a short, often focused, physical exam and asks about your medical history to assess: Your current symptoms. Your overall health. Your need for possible further medical intervention. What can I expect after the test? If you have a regular health care provider, make an appointment for a follow-up visit with him or her. If you do not have  a regular health care provider, ask about resources in your community. Your medical screening exam may determine that: You do not need emergency treatment at this time. You need treatment right away. You need to be transferred to another medical center. This may happen if you need an emergent specialist or consultant that is not available at the medical center you are at. You need to have more tests. A medical specialist may be consulted if needed. Get help right away if: Your condition gets worse. You develop new or troubling symptoms before you see your health care provider. These symptoms may represent a serious problem that is an emergency. Do not wait to see if the symptoms will go away. Get medical help right away. Call your local emergency services (911 in the [place] Do not drive yourself to the hospital. Summary A medical screening exam helps to determine whether you need medical treatment right away. This type of exam may be done in an emergency department, an urgent care setting, or your health care provider's office. During the exam, a health care provider does a short physical exam and asks about your current symptoms and overall health. Depending on the exam, more tests or therapies may be ordered. However, an MSE does not necessarily mean that you will have further medical testing if your symptoms are not deemed to be urgent. If you need further care that is not offered at your current medical center, you may need to be transferred to another facility. This information is not intended to replace advice given to you by your health care provider. Make sure you discuss any questions you have with your health care provider. Document Revised: 05/15/2021 Document Reviewed: 01/10/2021 Elsevier Patient Education  2024 [employer]    [email or web address] [facility] [address] [place], [place]  [address] Phone:[phone]

## 2024-10-13 NOTE — Assessment & Plan Note (Signed)
 Previously treated for hepatitis C with no detectable virus. Antibodies are present but not protective. No current need for hepatitis C testing unless symptoms arise. Ordered a hepatitis C antibody test to confirm the absence of active infection.

## 2024-10-14 ENCOUNTER — Telehealth: Payer: Self-pay

## 2024-10-14 LAB — TSH RFX ON ABNORMAL TO FREE T4: TSH: 1.99 u[IU]/mL (ref 0.450–4.500)

## 2024-10-14 LAB — HIV ANTIBODY (ROUTINE TESTING W REFLEX)
HIV 1&2 Ab, 4th Generation: NONREACTIVE
HIV FINAL INTERPRETATION: NEGATIVE

## 2024-10-14 NOTE — Telephone Encounter (Signed)
 Please advise on testing for Hepatis C. Please and thank you.   Copied from CRM #[id number]. Topic: Clinical - Lab/Test Results >> Oct 14, 2024  3:48 PM Chasity T wrote: Reason for CRM: Patient is calling because he states one of his labs was incorrect. He was supposed to be tested for Hep C but instead it was for HIV and would like for Dr Jesus or his nurse to give him a call.

## 2024-10-15 ENCOUNTER — Ambulatory Visit: Payer: Self-pay | Admitting: Internal Medicine

## 2024-10-15 LAB — HCV RNA QUANT RFLX ULTRA OR GENOTYP: HCV Quant Baseline: NOT DETECTED [IU]/mL

## 2024-10-17 ENCOUNTER — Ambulatory Visit: Admitting: Internal Medicine

## 2024-10-17 ENCOUNTER — Other Ambulatory Visit: Payer: Self-pay

## 2024-10-17 DIAGNOSIS — Z0001 Encounter for general adult medical examination with abnormal findings: Secondary | ICD-10-CM

## 2024-10-17 NOTE — Telephone Encounter (Signed)
 Sent pt a mychart message sorry about the wrong labs that was done to call office to make appt to have redrawn due to hep c

## 2024-11-22 ENCOUNTER — Ambulatory Visit: Admitting: Internal Medicine

## 2025-10-16 ENCOUNTER — Encounter: Admitting: Internal Medicine
